# Patient Record
Sex: Male | Born: 1990 | Race: White | Hispanic: No | Marital: Single | Smoking: Former smoker
Health system: Southern US, Community
[De-identification: ages and names within clinical notes are randomized; demographics above are authoritative.]

## PROBLEM LIST (undated history)

## (undated) DIAGNOSIS — F419 Anxiety disorder, unspecified: Secondary | ICD-10-CM

## (undated) DIAGNOSIS — F1910 Other psychoactive substance abuse, uncomplicated: Secondary | ICD-10-CM

## (undated) DIAGNOSIS — K219 Gastro-esophageal reflux disease without esophagitis: Secondary | ICD-10-CM

## (undated) DIAGNOSIS — F329 Major depressive disorder, single episode, unspecified: Secondary | ICD-10-CM

## (undated) DIAGNOSIS — F909 Attention-deficit hyperactivity disorder, unspecified type: Secondary | ICD-10-CM

## (undated) HISTORY — PX: KNEE SURGERY: SHX244

## (undated) HISTORY — PX: HERNIA REPAIR: SHX51

---
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
## 2010-08-08 ENCOUNTER — Encounter: Payer: Self-pay | Admitting: Sports Medicine

## 2010-11-30 NOTE — Procedures (Signed)
[facility]                              EXERCISE TREADMILL   NAME:[name], Craig Howe                      MRN:          [id number]  DATE:03/26/2007                            DOB:          Jan 22, 1991    INTRODUCTION:  The patient is a 20 year old young man who has recently  had episodes of chest tightness, palpitations and dizziness occurring  while running back kick offs for touchdowns in his high school football  games. He was found to have increased heart rates of up to 140 beats per  minute following these episodes with the sensation of dizziness. He is  referred for exercise treadmill testing to try to reproduce his  symptoms.   PROCEDURE:  After informed consent was obtained, the patient was prepped  in the usual manner. He was placed on the treadmill and underwent a  Bruce protocol exercise test where the patient walked 18 minutes,  completing stage 6 of a Bruce protocol. His heart rate was in the 80s,  and it gradually increased up to 160 beats per minute. His blood  pressure increased approximately from 130 mmHg up to 170 mmHg. He  achieved 20 METS of exercise. The test was stopped secondary to M.D.  discretion. Electrocardiogram evaluation demonstrated no ST segment  depression and no arrhythmias were noted on exercise. He was allowed to  recover in the usual manner without any difficulties. His heart rate and  blood pressure returned back to normal limits.   COMPLICATIONS:  There were no immediate procedure complications.   RESULTS:  This demonstrates a clinically and electrically negative  exercise treadmill test with no inducible arrhythmias.     Doylene Canning. Ladona Ridgel, MD  Electronically Signed    GWT/MedQ  DD: 03/26/2007  DT: 03/27/2007  Job #: [id number]   cc:   [name], M.D.  Genene Churn. [name], P.A.

## 2010-11-30 NOTE — Op Note (Signed)
NAME:  Craig Howe, Craig Howe               ACCOUNT NO.:  [id number]   MEDICAL RECORD NO.:  [id number]          PATIENT TYPE:  AMB   LOCATION:  DSC                          FACILITY:  [facility]   PHYSICIAN:  Robert A. Thurston Hole, M.D. DATE OF BIRTH:  May 13, 1991   DATE OF PROCEDURE:  07/07/2008  DATE OF DISCHARGE:                               OPERATIVE REPORT   PREOPERATIVE DIAGNOSIS:  Left knee medial plica and synovitis.   POSTOPERATIVE DIAGNOSIS:  Left knee medial plica and synovitis.   PROCEDURE:  Left knee examination under anesthesia followed by  arthroscopic medial plica excision with partial synovectomy.   SURGEON:  Elana Alm. Thurston Hole, MD   ANESTHESIA:  General.   OPERATIVE TIME:  30 minutes.   COMPLICATIONS:  None.   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  Significant pain.  Exam and MRI initially revealed  medial femoral condyle deep contusion with a posttraumatic plica.  He  has failed conservative care and has had persistent pain and is now to  undergo arthroscopy.   DESCRIPTION:  [name] was brought to the operating room on July 07, 2008, after a knee block was placed in the holding area by  Anesthesia.  He was placed on the operative table in the supine  position.  His left knee was examined under anesthesia.  He had full  range of motion of his knee with stable ligamentous exam with normal  patellar tracking.  Left leg was prepped using sterile DuraPrep and  draped using sterile technique.  Originally, through an anterolateral  portal, the arthroscope with a pump attached was placed into an  anteromedial portal and an arthroscopic probe was placed.  On initial  inspection, medial compartment of the articular cartilage was normal.  Medial meniscus was normal.  Intercondylar notch was inspected and  anterior and posterior cruciate ligaments were normal.   Lateral  compartment was inspected.  The articular cartilage was normal.  Lateral  meniscus was normal.  Popliteus tendon was normal.  Patellofemoral joint  and articular cartilage was inspected.  The articular cartilage was  normal.  The patella tracked normally.  There was a large thickened  medial plica band that was scarred and impinging into the medial  patellofemoral joint and this was thoroughly resected with a shaver.  Moderate lateral synovitis was also debrided, otherwise medial lateral  gutters were free of pathology.  After this was done, I felt that all  pathology had been satisfactorily addressed.  The instruments were  removed.  Portals were closed with 3-0 nylon suture and injected with  0.25% Marcaine with epinephrine.  Sterile dressing was applied, and the  patient was awakened and taken to the recovery room in stable condition.   FOLLOWUP CARE:  [name] will be followed as an outpatient on Vicodin and  Mobic.  He will be seen back in the office in a week for sutures out and  followup.      Robert A. Thurston Hole, M.D.  Electronically Signed  RAW/MEDQ  D:  07/07/2008  T:  07/07/2008  Job:  [id number]

## 2010-11-30 NOTE — Op Note (Signed)
NAME:  Craig Howe, Craig Howe               ACCOUNT NO.:  [id number]   MEDICAL RECORD NO.:  [id number]          PATIENT TYPE:  OIB   LOCATION:  6120                         FACILITY:  [facility]   PHYSICIAN:  Robert A. Thurston Hole, M.D. DATE OF BIRTH:  1991-02-09   DATE OF PROCEDURE:  DATE OF DISCHARGE:                               OPERATIVE REPORT   PREOPERATIVE DIAGNOSIS:  Left knee infection 1 week status post left  knee arthroscopy.   POSTOPERATIVE DIAGNOSIS:  Left knee infection 1 week status post left  knee arthroscopy.   PROCEDURE:  Left knee EUA followed by arthroscopic lavage with partial  synovectomy.   SURGEON:  Elana Alm. Thurston Hole, MD   ASSISTANT:  Julien Girt, P.A.   ANESTHESIA:  General.   OPERATIVE TIME:  Forty minutes.   COMPLICATIONS:  None.   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  He has developed increasing pain,  swelling, and fevers over the past 24-48 hours.  The aspiration  performed in the office today has revealed 40,000 white cells with no  organisms seen on Gram stain, but due to this significant finding and  probable infection, he is now to undergo arthroscopy with arthroscopic  lavage.   DESCRIPTION:  Aurthur was brought to the operating room on July 15, 2008, placed on the operating table in supine position.  After an  adequate level of general anesthesia was obtained, his left knee was  examined.  Range of motion from 0-90 degrees, 2+ effusion, knee stable  with normal patellar tracking.  Left leg was prepped using sterile  DuraPrep and draped using sterile technique.  Originally, through his  previous anterolateral and anteromedial incision, new arthroscopic  portals were made.  The arthroscope was placed in the knee from the  lateral portal and serous bloody fluid was obtained for further  cultures, both aerobic  and anaerobic.  After these cultures were  obtained, he received vancomycin 1 g intraoperatively.  On initial  inspection of the knee medial compartment, medial femoral condyle,  medial tibial plateau, articular cartilage was normal.  Medial meniscus  was normal.  Intercondylar notch inspected.  Anterior and posterior  cruciate ligaments were found to be normal.  Lateral compartment  inspected.  Articular cartilage  was normal.  Lateral meniscus was  normal.  Patellofemoral joint, articular cartilage was normal.  The  patella tracked normally.  There was found to be erythematous and  somewhat hemorrhagic synovitis in the anterior compartment, medial, and  lateral gutters, and suprapatellar pouch.  No frank purulence was noted,  but the significant synovitis was noted and a subtotal synovectomy was  carried out.  A 9000 mL of sterile saline were used to irrigate and  lavage the knee thoroughly during the course of this arthroscopic lavage  and subtotal synovectomy.  After this was done, no further pathology was  noted.  The arthroscopic  instruments were removed.  A drain was placed in the lateral portal and  both medial and lateral  portals were closed with 3-0 nylon suture.  Sterile dressings were applied and then the patient awakened and taken  to recovery in stable condition.  Needle and sponge counts correct x2 at  the end of the case.      Robert A. Thurston Hole, M.D.  Electronically Signed     RAW/MEDQ  D:  07/15/2008  T:  07/16/2008  Job:  [id number]

## 2010-11-30 NOTE — Letter (Signed)
March 26, 2007    Mr. Zonia Kief, PA-C  [facility]  [address]  [place], [place] [address]   RE:  Craig Howe, Craig Howe  MRN:  [id number]  /  DOB:  1990-09-18   Dear Mr. [name],   Thank you for referring Craig Howe for EP evaluation of recurrent  episodes of dizziness, shortness of breath, and chest discomfort.  As  you know, the patient is a 20 year old young man who plays football in  [employer], and who experienced an episode of shortness of  breath, chest discomfort, and palpitations following the running back of  a 50-yard touchdown during his football game last Thursday night.  The  patient eventually felt better but did not play the second half.  I note  in your letter that he had a carotid sinus massage which did not affect  his heart rate.  The patient does admit to using multiple vitamins,  protein supplements, and creatine.  He denies the use of any anabolic  steroids.  A long clinic note has been dictated and will be sent to you,  if not already received, basically highlighting the additional past  medical history and physical exam findings on Craig Howe.  To summarize  these findings we found today that his heart rate increased fairly  markedly by going from lying to the standing position, 56 to 97 beats  per minutes from lying to standing, raising the question of volume  depletion and orthostasis contributing to his symptoms.  The patient  does admit to not, perhaps, drinking as much as he knows that he should.  Apparently, his prior history of competitive wrestling has resulted in  him tending to avoid drinking to keep his weight down.  The patient had  a 2D echo today which demonstrated normal left ventricular systolic size  and function, and normal right ventricular systolic size and function,  and no valvular abnormalities.  In addition, the cardiac chambers were  not thickened.  An exercise treadmill test  was also carried out where he  walked 18 minutes on a Bruce protocol, achieving a very high rate  pressure product and exercise load.  Despite this very advanced exercise  test, he had no evidence of ischemia, no induced split arrhythmias were  noted, both during exercise and in recovery.   With all of the above, I recommended that the patient be allowed to  return to competitive sports.  I have recommended that he very  vigorously increase his fluid hydration and salt intake, as I suspect a  lot of this is related to dehydration in the setting of very, very  vigorous competitive sports.  If his symptoms were to recur, then  additional evaluation would be considered.  Finally, I have asked that  he stop any protein or metabolic supplements.  I do not think he is  using steroids, but I have, again, encouraged him not to drink anything  except for fluids and eat a good, healthy diet.   Thanks, again, for referring Craig Howe for EP evaluation.    Sincerely,      Craig Canning. Ladona Ridgel, MD  Electronically Signed    GWT/MedQ  DD: 03/26/2007  DT: 03/27/2007  Job #: [id number]   CC:   [name], PA-C

## 2010-11-30 NOTE — Assessment & Plan Note (Signed)
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  The patient is a very pleasant 20-year-  old young man, whose health has been very good for the last 3 years.  He  has a history of ADHD and has been on Concerta for 3 years.  The patient  exercises very vigorously and regularly.  He states that during the  winter and spring he wrestles competitively and has played football for  several years now.  He works out extensively with [employer].  The patient  was noted to have run back a 90-yard touchdown run during his last  football game, and afterwards became lightheaded and noted some chest  tightness and shortness of breath.  Eventually, these spells improved,  but he was noted by Zonia Kief, P.A., with [facility], to be  tachycardic afterwards, with a persistent elevation of his pulse rate of  144 beats per minute.  By report, carotid sinus massage was carried out,  which did not slow down his heart racing.  Eventually, he did settle  down, and his heart rate returned back to normal.  He was not allowed to  play the second half of his football game.  The patient has never had  frank syncope.  He does admit to have taken some nutritional supplements  in the last several weeks, including creatine and a protein-based  supplement, but specifically denies using anabolic steroids.  He had no  other specific complaints.  He does note that these episodes have been  present for the last couple of weeks, during his football games.   ADDITIONAL PAST MEDICAL HISTORY:   Negative.   FAMILY HISTORY:  Negative for premature coronary disease or other  medical problems.  His parents are both with him today and are in good  health.   PAST SURGICAL HISTORY:  Notable for hernia repair over 10 years ago.   MEDICATIONS:  Concerta 27 mg daily.   REVIEW OF SYSTEMS:  Negative, except as noted in the HPI, otherwise all  systems reviewed and found to be negative.   PHYSICAL EXAMINATION:  GENERAL:  He is a very pleasant, well-appearing  young man in no distress.  VITAL SIGNS:  His initial heart rate was 56, with a blood pressure of  116/63.  On standing, his blood pressure increased to 127, and his heart  rate actually increased to 90 beats per minute.  After 5 minutes, his  heart rate was 97 beats per minute.  He was asymptomatic.  HEENT:  Normocephalic, atraumatic.  Pupils equal and round.  Oropharynx  is moist.  Sclerae anicteric.  NECK:  Revealed no jugular venous distention.  There is no thyromegaly.  The trachea is midline.  The carotids are 2+ and symmetric.  LUNGS:  Clear bilateral to auscultation.  No wheezes, rales or rhonchi  are present.  CARDIOVASCULAR:  Regular rate and rhythm, normal S1 and S2.  There are  no murmurs, rubs or gallops present.  There was no S3, and I could not  appreciate an S4 gallop.  His PMI was not enlarged, nor was it laterally  displaced.  ABDOMEN:  Exam was scaphoid, nontender, nondistended.  There is no  organomegaly.  Bowel sounds are present.  There is no rebound or  guarding.  EXTREMITIES:  Demonstrated no cyanosis, clubbing or edema.  The pulses  are 2+ and symmetric.  NEUROLOGIC:  Alert and oriented x 3.  Normal axis intervals.  MUSCULOSKELETAL:  Normal.  SKIN:  Normal.   EKG demonstrates sinus rhythm with incomplete right bundle branch block.   A 2D echo was obtained.  While the patient was here.  The final results  will be dictated but demonstrated normal LV and RV systolic function,  with no valvular lesions  and no effusions.   IMPRESSION:  Chest pain and shortness of breath, dizziness and  lightheadedness following extreme exertion.   DISCUSSION:  The etiology of the patient's symptoms are still unclear;  however, he has showed no evidence of any occult ischemic heart disease  on stress testing at very maximum work loads, and his 2D echo is normal  with no evidence of septal or left ventricular free wall thickening,  essentially excluding hypertrophic cardiomyopathy.  In addition, his  right ventricle function is normal, and this would certainly go against  a erythemogenic right ventricular dysplasia.  The patient does have  evidence of some orthostasis based on increased heart rate, in response  to upright posture, raising the question of whether or not his main  complaint was that he was in fact dehydrated.  He did note on further  question that, at times around practices, he will get cramps in his arms  and legs.  With all of the above, I have recommended that the patient be  allowed to return to competitive sports.  I have asked that he increase  his fluid and sodium intake and increase his drinking of sports drinks.  Obviously, if he gets tired, he should be allowed to rest.  I will plan  to see the patient back on an as-needed basis.     Doylene Canning. Ladona Ridgel, MD  Electronically Signed    GWT/MedQ  DD: 03/26/2007  DT: 03/27/2007  Job #: [id number]   cc:   [facility], [place]. [name], P.A.

## 2011-04-22 LAB — CBC
HCT: 37 % — ABNORMAL LOW (ref 39.0–52.0)
HCT: 38 % — ABNORMAL LOW (ref 39.0–52.0)
MCV: 89.6 fL (ref 78.0–100.0)
Platelets: 175 10*3/uL (ref 150–400)
RBC: 4.13 MIL/uL — ABNORMAL LOW (ref 4.22–5.81)
RBC: 4.14 MIL/uL — ABNORMAL LOW (ref 4.22–5.81)
WBC: 5.5 10*3/uL (ref 4.0–10.5)
WBC: 5.9 10*3/uL (ref 4.0–10.5)

## 2011-04-22 LAB — PROTIME-INR
INR: 1 (ref 0.00–1.49)
Prothrombin Time: 13.7 seconds (ref 11.6–15.2)
Prothrombin Time: 14.6 seconds (ref 11.6–15.2)

## 2011-04-22 LAB — ANAEROBIC CULTURE

## 2011-04-22 LAB — BASIC METABOLIC PANEL
BUN: 10 mg/dL (ref 6–23)
Calcium: 9.3 mg/dL (ref 8.4–10.5)
Glucose, Bld: 97 mg/dL (ref 70–99)
Potassium: 3.9 mEq/L (ref 3.5–5.1)
Sodium: 137 mEq/L (ref 135–145)
Sodium: 138 mEq/L (ref 135–145)

## 2011-04-22 LAB — CULTURE, BLOOD (ROUTINE X 2)

## 2011-04-22 LAB — APTT: aPTT: 40 seconds — ABNORMAL HIGH (ref 24–37)

## 2011-04-22 LAB — BODY FLUID CULTURE

## 2011-04-22 LAB — POCT HEMOGLOBIN-HEMACUE: Hemoglobin: 16.1 g/dL (ref 13.0–17.0)

## 2016-10-26 DIAGNOSIS — F1121 Opioid dependence, in remission: Secondary | ICD-10-CM | POA: Insufficient documentation

## 2016-10-26 DIAGNOSIS — F322 Major depressive disorder, single episode, severe without psychotic features: Secondary | ICD-10-CM | POA: Insufficient documentation

## 2016-10-26 DIAGNOSIS — F902 Attention-deficit hyperactivity disorder, combined type: Secondary | ICD-10-CM | POA: Insufficient documentation

## 2017-07-25 ENCOUNTER — Ambulatory Visit (HOSPITAL_COMMUNITY)
Admission: RE | Admit: 2017-07-25 | Discharge: 2017-07-25 | Disposition: A | Discharge status: Home / Self Care | Payer: BLUE CROSS/BLUE SHIELD | Source: Ambulatory Visit | Attending: Cardiovascular Disease | Admitting: Cardiovascular Disease

## 2017-07-25 ENCOUNTER — Other Ambulatory Visit (HOSPITAL_COMMUNITY): Payer: Self-pay | Admitting: Orthopedic Surgery

## 2017-07-25 DIAGNOSIS — M79604 Pain in right leg: Secondary | ICD-10-CM | POA: Insufficient documentation

## 2017-07-25 DIAGNOSIS — M79661 Pain in right lower leg: Secondary | ICD-10-CM | POA: Diagnosis not present

## 2017-07-25 DIAGNOSIS — R936 Abnormal findings on diagnostic imaging of limbs: Secondary | ICD-10-CM | POA: Insufficient documentation

## 2017-07-25 DIAGNOSIS — M7989 Other specified soft tissue disorders: Principal | ICD-10-CM

## 2017-08-15 ENCOUNTER — Encounter: Payer: Self-pay | Admitting: Physician Assistant

## 2017-08-15 ENCOUNTER — Ambulatory Visit: Payer: BLUE CROSS/BLUE SHIELD | Admitting: Physician Assistant

## 2017-08-15 VITALS — BP 128/75 | HR 74 | Temp 96.8°F | Ht 74.0 in | Wt 191.4 lb

## 2017-08-15 DIAGNOSIS — Z87898 Personal history of other specified conditions: Secondary | ICD-10-CM | POA: Diagnosis not present

## 2017-08-15 DIAGNOSIS — F1911 Other psychoactive substance abuse, in remission: Secondary | ICD-10-CM

## 2017-08-15 DIAGNOSIS — Z01818 Encounter for other preprocedural examination: Secondary | ICD-10-CM

## 2017-08-15 NOTE — Progress Notes (Signed)
BP 128/75   Pulse 74   Temp (!) 96.8 F (36 C) (Oral)   Ht 6\' 2"  (1.88 m)   Wt 191 lb 6.4 oz (86.8 kg)   BMI 24.57 kg/m    Subjective:    Patient ID: Craig Howe, male    DOB: [birth year], 27 y.o.   MRN: [id number]  HPI: UNNAMED ZEIEN is a 27 y.o. male presenting on 08/15/2017 for New Patient (Initial Visit) (Needs surgical clearance ) and std check  This patient comes in for pre-op physical examination. All medications are reviewed today. There are no reports of any problems with the medications. All of the medical conditions are reviewed and updated.  Lab work is reviewed and will be ordered as medically necessary. There are no new problems reported with today's visit.  Patient reports doing well overall. Old EKG changes of RBBB are found and same as 2008 on stress test. Medically clear.  Relevant past medical, surgical, family and social history reviewed and updated as indicated. Allergies and medications reviewed and updated.  History reviewed. No pertinent past medical history.  Past Surgical History:  Procedure Laterality Date  . HERNIA REPAIR    . KNEE SURGERY      Review of Systems  Constitutional: Negative.  Negative for appetite change and fatigue.  HENT: Negative.   Eyes: Negative.  Negative for pain and visual disturbance.  Respiratory: Negative.  Negative for cough, chest tightness, shortness of breath and wheezing.   Cardiovascular: Negative.  Negative for chest pain, palpitations and leg swelling.  Gastrointestinal: Negative.  Negative for abdominal pain, diarrhea, nausea and vomiting.  Endocrine: Negative.   Genitourinary: Negative.   Musculoskeletal: Negative.   Skin: Negative.  Negative for color change and rash.  Neurological: Negative.  Negative for weakness, numbness and headaches.  Psychiatric/Behavioral: Negative.     Allergies as of 08/15/2017   No Known Allergies     Medication List        Accurate as of 08/15/17  3:43 PM. Always use  your most recent med list.          cloNIDine 0.1 MG tablet Commonly known as:  CATAPRES TAKE 1 TABLET TWICE DAILY FOR WITHDRAWAL   ZUBSOLV 2.9-0.71 MG Subl Generic drug:  Buprenorphine HCl-Naloxone HCl PLACE 1 TABLET UNDER TONGUE TWICE DIALY          Objective:    BP 128/75   Pulse 74   Temp (!) 96.8 F (36 C) (Oral)   Ht 6\' 2"  (1.88 m)   Wt 191 lb 6.4 oz (86.8 kg)   BMI 24.57 kg/m   No Known Allergies  Physical Exam  Constitutional: He appears well-developed and well-nourished.  HENT:  Head: Normocephalic and atraumatic.  Eyes: Conjunctivae and EOM are normal. Pupils are equal, round, and reactive to light.  Neck: Normal range of motion. Neck supple.  Cardiovascular: Normal rate, regular rhythm and normal heart sounds.  Pulmonary/Chest: Effort normal and breath sounds normal.  Abdominal: Soft. Bowel sounds are normal.  Musculoskeletal: Normal range of motion.  Skin: Skin is warm and dry.  Nursing note and vitals reviewed.       Assessment & Plan:   1. Preoperative clearance Medically clear for surgery 02/30/2019 EKG changes are stable since 2008 No new findings - EKG 12-Lead  2. History of substance abuse     Current Outpatient Medications:  .  cloNIDine (CATAPRES) 0.1 MG tablet, TAKE 1 TABLET TWICE DAILY FOR WITHDRAWAL, Disp: , Rfl: 0 .  ZUBSOLV 2.9-0.71 MG SUBL, PLACE 1 TABLET UNDER TONGUE TWICE DIALY, Disp: , Rfl: 0 Continue all other maintenance medications as listed above.  Follow up plan: No Follow-up on file.  Educational handout given for survey  Remus LofflerAngel S. Zniyah Midkiff PA-C [facility] [address]  [place], [place] [id number] [phone]   08/15/2017, 3:43 PM

## 2017-08-15 NOTE — Patient Instructions (Signed)
In a few days you may receive a survey in the mail or online from [employer] regarding your visit with us today. Please take a moment to fill this out. Your feedback is very important to our whole office. It can help us better understand your needs as well as improve your experience and satisfaction. Thank you for taking your time to complete it. We care about you.  Jewel Venditto, PA-C  

## 2017-08-16 ENCOUNTER — Other Ambulatory Visit: Payer: Self-pay | Admitting: Physician Assistant

## 2017-08-16 DIAGNOSIS — Z202 Contact with and (suspected) exposure to infections with a predominantly sexual mode of transmission: Secondary | ICD-10-CM

## 2017-08-25 ENCOUNTER — Encounter: Payer: Self-pay | Admitting: Physical Therapy

## 2017-08-25 ENCOUNTER — Other Ambulatory Visit: Payer: Self-pay

## 2017-08-25 ENCOUNTER — Ambulatory Visit: Payer: BLUE CROSS/BLUE SHIELD | Attending: Orthopedic Surgery | Admitting: Physical Therapy

## 2017-08-25 DIAGNOSIS — M25561 Pain in right knee: Secondary | ICD-10-CM

## 2017-08-25 NOTE — Therapy (Signed)
[facility] [address] [place], [place], [address] Phone: [phone]   Fax:  [phone]  Physical Therapy Evaluation  Patient Details  Name: Craig FurnishDaniel D Howe MRN: [id number] Date of Birth: 12/20/1990 Referring Provider: Frederico Hammananiel Caffrey MD   Encounter Date: 08/25/2017  PT End of Session - 08/25/17 1136    Visit Number  1    Number of Visits  12    Date for PT Re-Evaluation  10/06/17    PT Start Time  1038    PT Stop Time  1106    PT Time Calculation (min)  28 min       History reviewed. No pertinent past medical history.  Past Surgical History:  Procedure Laterality Date  . HERNIA REPAIR    . KNEE SURGERY      There were no vitals filed for this visit.   Subjective Assessment - 08/25/17 1152    Subjective  The patient underwent a right knee arthroscopic surgery on 08/16/17.  He states he was to go to the doctor yesterday but missed his appointment.  He is keeping bandaids over his stitches (port sites).  He under a medial meniscectomy.  He rates his pain at a 8/10 today.  Rest and ice decrease his pain.  Increased up time increases pain.      Patient Stated Goals  Get back to normal.    Currently in Pain?  Yes    Pain Score  8     Pain Location  Knee    Pain Orientation  Right    Pain Descriptors / Indicators  Aching;Throbbing    Pain Type  Surgical pain    Pain Onset  1 to 4 weeks ago    Pain Frequency  Constant    Aggravating Factors   See above.    Pain Relieving Factors  See above.         [facility] - 08/25/17 0001      Assessment   Medical Diagnosis  Left knee scope medial meniscectomy.    Referring Provider  Frederico Hammananiel Caffrey MD    Onset Date/Surgical Date  -- 08/16/17 (surgery date).      Precautions   Precautions  -- Pain-free right quadriceps strengthening.      Restrictions   Weight Bearing Restrictions  No      Balance Screen   Has the patient fallen in the past 6 months  No    Has the  patient had a decrease in activity level because of a fear of falling?   No    Is the patient reluctant to leave their home because of a fear of falling?   No      Home Environment   Living Environment  Private residence      Prior Function   Level of Independence  Independent      Observation/Other Assessments   Observations  Bandaids over right knee scope sites.      Observation/Other Assessments-Edema    Edema  -- Minimal right knee edema.      ROM / Strength   AROM / PROM / Strength  AROM;Strength      AROM   Overall AROM Comments  Full active right knee range of motion.      Strength   Overall Strength Comments  No significant right LE strength deficits.      Palpation   Palpation comment  Patient CC of pain today was in region of righ tknee medial joint  line and superior aspect of right patella.      Ambulation/Gait   Gait Comments  Mild gait antalgia.             Objective measurements completed on examination: See above findings.      [facility] Adult PT Treatment/Exercise - 08/25/17 0001      Modalities   Modalities  Vasopneumatic      Vasopneumatic   Number Minutes Vasopneumatic   10 minutes    Vasopnuematic Location   -- Right knee.    Vasopneumatic Pressure  Medium                  PT Long Term Goals - 08/25/17 1229      PT LONG TERM GOAL #1   Title  Independent with a HEP.    Time  6    Period  Weeks    Status  New      PT LONG TERM GOAL #2   Title  Walk in clinic 500 feet with a normal gait pattern.    Time  6    Period  Weeks    Status  New      PT LONG TERM GOAL #3   Title  Perform a reciprocating stair gait with pain not > 2/10.    Time  6    Period  Weeks    Status  New      PT LONG TERM GOAL #4   Title  Perform ADL's with pain not > 2-3/10.    Time  6    Period  Weeks    Status  New             Plan - 08/25/17 1221    Clinical Impression Statement  The patient presents to [facility] s/p left knee medial  meniscectomy performed on 08/16/17.  He still has stitches over his right knee scope sites as he missed his MD appointment yesterday.  He demonstrates full right knee range of motion and loss significant loss of strength.  He has minimal left knee edema and has a high pain-level. Patient will benefit from skilled physcial therapy intervention to decrease pain and increase function.    Clinical Presentation  Stable    Clinical Presentation due to:  Good surgicla outcome.    Clinical Decision Making  Low    Rehab Potential  Excellent    PT Frequency  2x / week    PT Duration  6 weeks    PT Treatment/Interventions  ADLs/Self Care Home Management;Cryotherapy;Electrical Stimulation;Functional mobility training;Therapeutic activities;Neuromuscular re-education;Therapeutic exercise;Patient/family education;Manual techniques;Vasopneumatic Device    PT Next Visit Plan  Pain-free left quadricep exercises; stationary bike; vasopneumatic and electrical stimulation.    Consulted and Agree with Plan of Care  Patient       Patient will benefit from skilled therapeutic intervention in order to improve the following deficits and impairments:  Abnormal gait, Decreased activity tolerance, Increased edema, Pain  Visit Diagnosis: Acute pain of right knee - Plan: PT plan of care cert/re-cert     Problem List Patient Active Problem List   Diagnosis Date Noted  . History of substance abuse 08/15/2017    Craig Howe, [place] MPT 08/25/2017, 12:43 PM  [facility] [address] [place], [place], [address] Phone: [phone]   Fax:  [phone]  Name: Craig REIERSON MRN: [id number] Date of Birth: 10/05/1990

## 2017-08-29 ENCOUNTER — Encounter: Payer: Self-pay | Admitting: Physical Therapy

## 2017-08-29 ENCOUNTER — Ambulatory Visit: Payer: BLUE CROSS/BLUE SHIELD | Admitting: Physical Therapy

## 2017-08-29 DIAGNOSIS — M25561 Pain in right knee: Secondary | ICD-10-CM

## 2017-08-29 NOTE — Therapy (Signed)
[facility] [address] [place], [place], [address] Phone: [phone]   Fax:  [phone]  Physical Therapy Treatment  Patient Details  Name: Craig Howe MRN: [id number] Date of Birth: [birth year] Referring Provider: Frederico Hamman MD   Encounter Date: 08/29/2017  PT End of Session - 08/29/17 0737    Visit Number  2    Number of Visits  12    Date for PT Re-Evaluation  10/06/17    PT Start Time  0735    PT Stop Time  0821    PT Time Calculation (min)  46 min    Activity Tolerance  Patient tolerated treatment well    Behavior During Therapy  [facility] for tasks assessed/performed       History reviewed. No pertinent past medical history.  Past Surgical History:  Procedure Laterality Date  . HERNIA REPAIR    . KNEE SURGERY      There were no vitals filed for this visit.  Subjective Assessment - 08/29/17 0736    Subjective  Reports that he got his stitches removed yesterday.    Patient Stated Goals  Get back to normal.    Currently in Pain?  Yes    Pain Score  3     Pain Location  Knee    Pain Orientation  Right    Pain Descriptors / Indicators  Sore    Pain Type  Surgical pain    Pain Onset  1 to 4 weeks ago         [facility] PT Assessment - 08/29/17 0001      Assessment   Medical Diagnosis  Left knee scope medial meniscectomy.    Onset Date/Surgical Date  08/16/17    Next MD Visit  TBD      Restrictions   Weight Bearing Restrictions  No                  [facility] Adult PT Treatment/Exercise - 08/29/17 0001      Exercises   Exercises  Knee/Hip      Knee/Hip Exercises: Aerobic   Stationary Bike  L2 x12 min      Knee/Hip Exercises: Standing   Terminal Knee Extension  Strengthening;Right;20 reps;Limitations    Terminal Knee Extension Limitations  Pink XTS 5 sec hold      Knee/Hip Exercises: Supine   Bridges  Strengthening;Both;15 reps    Straight Leg Raises  Strengthening;Right;2 sets;10 reps     Straight Leg Raise with External Rotation  Strengthening;Right;2 sets;10 reps      Modalities   Modalities  Programmer, applications Location  R knee    Electrical Stimulation Action  IFC    Electrical Stimulation Parameters  1-10 hz x15 min    Electrical Stimulation Goals  Pain;Edema      Vasopneumatic   Number Minutes Vasopneumatic   15 minutes    Vasopnuematic Location   Knee    Vasopneumatic Pressure  Medium    Vasopneumatic Temperature   34             PT Education - 08/29/17 0809    Education provided  Yes    Education Details  HEP- SLR, SLR with ER, hip abduction    Person(s) Educated  Patient    Methods  Explanation;Handout    Comprehension  Verbalized understanding          PT Long Term Goals - 08/25/17 1229  PT LONG TERM GOAL #1   Title  Independent with a HEP.    Time  6    Period  Weeks    Status  New      PT LONG TERM GOAL #2   Title  Walk in clinic 500 feet with a normal gait pattern.    Time  6    Period  Weeks    Status  New      PT LONG TERM GOAL #3   Title  Perform a reciprocating stair gait with pain not > 2/10.    Time  6    Period  Weeks    Status  New      PT LONG TERM GOAL #4   Title  Perform ADL's with pain not > 2-3/10.    Time  6    Period  Weeks    Status  New            Plan - 08/29/17 16100814    Clinical Impression Statement  Patient arrived to treatment with steristrips placed over incision ports of R knee and no AD. Patient presented in clinic with only low level R knee soreness. Patient guided through mid level R knee standing strengthening exercises with no complaints of any pain. Patient ambulated forward and lateral steps with only minimal R knee weakness but patient has been ambulating stairs at home. Patient also guided through supine knee strengthening exercises with only weakness reported by patient. VCs to maintain quad activation/ straight  knee provided by PTA. Patient provided supine knee strengthening HEP for home with education regarding parameters and technique. Normal modalities response noted following removal of the modalities.    Rehab Potential  Excellent    PT Frequency  2x / week    PT Duration  6 weeks    PT Treatment/Interventions  ADLs/Self Care Home Management;Cryotherapy;Electrical Stimulation;Functional mobility training;Therapeutic activities;Neuromuscular re-education;Therapeutic exercise;Patient/family education;Manual techniques;Vasopneumatic Device    PT Next Visit Plan  Pain-free left quadricep exercises; stationary bike; vasopneumatic and electrical stimulation.    PT Home Exercise Plan  HEP- SLR, SLR with ER, hip abduction    Consulted and Agree with Plan of Care  Patient       Patient will benefit from skilled therapeutic intervention in order to improve the following deficits and impairments:  Abnormal gait, Decreased activity tolerance, Increased edema, Pain  Visit Diagnosis: Acute pain of right knee     Problem List Patient Active Problem List   Diagnosis Date Noted  . History of substance abuse 08/15/2017    Marvell FullerKelsey P Nykiah Ma, PTA 08/29/2017, 8:31 AM  [facility] [address] [place], [place], [address] Phone: [phone]   Fax:  [phone]  Name: Craig FurnishDaniel D Howe MRN: [id number] Date of Birth: Jun 13, 1991

## 2017-08-29 NOTE — Patient Instructions (Addendum)
Strengthening: Straight Leg Raise (Phase 1)    Tighten muscles on front of right thigh, then lift leg __6__ inches from surface, keeping knee locked.  Repeat ___10_ times per set. Do __2__ sets per session. Do _2-3___ sessions per day.  http://orth.exer.us/614   Copyright  VHI. All rights reserved.  Straight Leg Raise: With External Leg Rotation    Lie on back with right leg straight, opposite leg bent. Rotate straight leg out and lift __6__ inches. Repeat _10___ times per set. Do __2__ sets per session. Do __2-3__ sessions per day.  http://orth.exer.us/728   Copyright  VHI. All rights reserved.  Strengthening: Hip Abduction (Side-Lying)    Tighten muscles on front of right thigh, then lift leg _6___ inches from surface, keeping knee locked.  Repeat __10__ times per set. Do __2__ sets per session. Do _2-3__ sessions per day.  http://orth.exer.us/622   Copyright  VHI. All rights reserved.

## 2017-08-31 ENCOUNTER — Encounter: Payer: PRIVATE HEALTH INSURANCE | Admitting: Physical Therapy

## 2017-09-01 ENCOUNTER — Encounter: Payer: PRIVATE HEALTH INSURANCE | Admitting: Physical Therapy

## 2017-09-06 ENCOUNTER — Ambulatory Visit: Payer: BLUE CROSS/BLUE SHIELD | Admitting: Physical Therapy

## 2017-09-06 ENCOUNTER — Encounter: Payer: Self-pay | Admitting: Physical Therapy

## 2017-09-06 DIAGNOSIS — M25561 Pain in right knee: Secondary | ICD-10-CM | POA: Diagnosis not present

## 2017-09-06 NOTE — Therapy (Signed)
[facility] [address] [place], [place], [address] Phone: [phone]   Fax:  [phone]  Physical Therapy Treatment  Patient Details  Name: Craig Howe MRN: [id number] Date of Birth: 1990/11/08 Referring Provider: Frederico Hammananiel Caffrey MD   Encounter Date: 09/06/2017  PT End of Session - 09/06/17 0745    Visit Number  3    Number of Visits  12    Date for PT Re-Evaluation  10/06/17    PT Start Time  0733    PT Stop Time  0815    PT Time Calculation (min)  42 min    Activity Tolerance  Patient tolerated treatment well    Behavior During Therapy  [facility] for tasks assessed/performed       History reviewed. No pertinent past medical history.  Past Surgical History:  Procedure Laterality Date  . HERNIA REPAIR    . KNEE SURGERY      There were no vitals filed for this visit.  Subjective Assessment - 09/06/17 0744    Subjective  Reports that he slipped on wet ground and caught himself on his R knee. Reports that after surgery the MD said his R ACL was loose but states that his knee doesn't feel like he tore it as he tore his L ACL previously. Reports that his knee is still swollen.    Patient Stated Goals  Get back to normal.    Currently in Pain?  Yes    Pain Score  5     Pain Location  Knee    Pain Orientation  Right    Pain Descriptors / Indicators  Sore    Pain Type  Surgical pain    Pain Onset  1 to 4 weeks ago         [facility] - 09/06/17 0001      Assessment   Medical Diagnosis  Left knee scope medial meniscectomy.    Onset Date/Surgical Date  08/16/17    Next MD Visit  08/2017      Restrictions   Weight Bearing Restrictions  No                  [facility] Adult PT Treatment/Exercise - 09/06/17 0001      Knee/Hip Exercises: Aerobic   Stationary Bike  L3 x14 min      Knee/Hip Exercises: Supine   Short Arc Quad Sets  Strengthening;Right;20 reps;Limitations    Short Arc Quad Sets Limitations  5  sec hold x30 reps    Jabil CircuitBridges  Strengthening;Both;15 reps    Straight Leg Raises  Strengthening;Right;2 sets;10 reps    Straight Leg Raise with External Rotation  Strengthening;Right;2 sets;10 reps      Knee/Hip Exercises: Sidelying   Hip ABduction  Strengthening;Right;20 reps    Clams  R clam x20 reps      Knee/Hip Exercises: Prone   Straight Leg Raises  Strengthening;Right;20 reps      Modalities   Modalities  Programmer, applicationslectrical Stimulation;Vasopneumatic      Electrical Stimulation   Electrical Stimulation Location  R knee    Electrical Stimulation Action  IFC    Electrical Stimulation Parameters  1-10 hz x15 min    Electrical Stimulation Goals  Pain      Vasopneumatic   Number Minutes Vasopneumatic   15 minutes    Vasopnuematic Location   Knee    Vasopneumatic Pressure  Medium    Vasopneumatic Temperature   34  PT Long Term Goals - 08/25/17 1229      PT LONG TERM GOAL #1   Title  Independent with a HEP.    Time  6    Period  Weeks    Status  New      PT LONG TERM GOAL #2   Title  Walk in clinic 500 feet with a normal gait pattern.    Time  6    Period  Weeks    Status  New      PT LONG TERM GOAL #3   Title  Perform a reciprocating stair gait with pain not > 2/10.    Time  6    Period  Weeks    Status  New      PT LONG TERM GOAL #4   Title  Perform ADL's with pain not > 2-3/10.    Time  6    Period  Weeks    Status  New            Plan - 09/06/17 1610    Clinical Impression Statement  Patient arrived in treatment with reports of recent slip on wet ground will only report of soreness and inflammation of R knee. Patient guided through low level R knee strengthening without complaint of any increased pain. Patient did report complaint of difficulty with SLR. Minimal superficial R knee abrasion observed from slip on wet ground. Normal modalities response noted following removal of the modalities.    Rehab Potential  Excellent    PT  Frequency  2x / week    PT Duration  6 weeks    PT Treatment/Interventions  ADLs/Self Care Home Management;Cryotherapy;Electrical Stimulation;Functional mobility training;Therapeutic activities;Neuromuscular re-education;Therapeutic exercise;Patient/family education;Manual techniques;Vasopneumatic Device    PT Next Visit Plan  Pain-free left quadricep exercises; stationary bike; vasopneumatic and electrical stimulation.    PT Home Exercise Plan  HEP- SLR, SLR with ER, hip abduction    Consulted and Agree with Plan of Care  Patient       Patient will benefit from skilled therapeutic intervention in order to improve the following deficits and impairments:  Abnormal gait, Decreased activity tolerance, Increased edema, Pain  Visit Diagnosis: Acute pain of right knee     Problem List Patient Active Problem List   Diagnosis Date Noted  . History of substance abuse 08/15/2017    Marvell Fuller, PTA 09/06/2017, 8:35 AM  [facility] [address] [place], [place], [address] Phone: [phone]   Fax:  [phone]  Name: Craig Howe MRN: [id number] Date of Birth: 1990/09/09

## 2017-09-08 ENCOUNTER — Encounter: Payer: PRIVATE HEALTH INSURANCE | Admitting: Physical Therapy

## 2017-09-12 ENCOUNTER — Ambulatory Visit: Payer: BLUE CROSS/BLUE SHIELD | Admitting: Physical Therapy

## 2017-09-12 DIAGNOSIS — M25561 Pain in right knee: Secondary | ICD-10-CM

## 2017-09-12 NOTE — Therapy (Signed)
[facility] [address] [place], [place], [address] Phone: [phone]   Fax:  [phone]  Physical Therapy Treatment  Patient Details  Name: Craig Howe MRN: [id number] Date of Birth: 1991/06/28 Referring Provider: Frederico Hammananiel Caffrey MD   Encounter Date: 09/12/2017  PT End of Session - 09/12/17 0732    Visit Number  4    Number of Visits  12    Date for PT Re-Evaluation  10/06/17    PT Start Time  0730    PT Stop Time  0823    PT Time Calculation (min)  53 min    Activity Tolerance  Patient tolerated treatment well    Behavior During Therapy  [facility] for tasks assessed/performed       No past medical history on file.  Past Surgical History:  Procedure Laterality Date  . HERNIA REPAIR    . KNEE SURGERY      There were no vitals filed for this visit.  Subjective Assessment - 09/12/17 0755    Subjective  Patient reports pain is very minimal. 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    Patient Stated Goals  Get back to normal.    Currently in Pain?  Yes    Pain Score  1     Pain Orientation  Right    Pain Descriptors / Indicators  Sore    Pain Type  Surgical pain    Pain Onset  1 to 4 weeks ago         [facility] - 09/12/17 0001      Assessment   Medical Diagnosis  Left knee scope medial meniscectomy.    Onset Date/Surgical Date  08/16/17    Next MD Visit  08/2017      Restrictions   Weight Bearing Restrictions  No                  [facility] Adult PT Treatment/Exercise - 09/12/17 0001      Knee/Hip Exercises: Aerobic   Stationary Bike  L4 x15 min      Knee/Hip Exercises: Standing   Terminal Knee Extension  Strengthening;Right    Terminal Knee Extension Limitations  Pink XTS 5 second hold      Knee/Hip Exercises: Supine   Short Arc Quad Sets  Strengthening;Both;2 sets;10 reps 1# weight    Single Leg Bridge  Strengthening;Right;2 sets;10 reps    Straight Leg  Raises  Strengthening;3 sets;10 reps      Knee/Hip Exercises: Sidelying   Hip ABduction  Strengthening;Right;3 sets;10 reps    Hip ADduction  Strengthening;Right;3 sets;10 reps      Knee/Hip Exercises: Prone   Straight Leg Raises  Strengthening;Right;3 sets;10 reps      Modalities   Modalities  Geologist, engineeringlectrical Stimulation      Electrical Stimulation   Electrical Stimulation Location  R knee    Electrical Stimulation Action  IFC    Electrical Stimulation Parameters  1-10 hz x15 min    Electrical Stimulation Goals  Pain      Vasopneumatic   Number Minutes Vasopneumatic   15 minutes    Vasopnuematic Location   Knee    Vasopneumatic Pressure  Medium    Vasopneumatic Temperature   34                  PT Long Term Goals - 08/25/17 1229      PT LONG TERM GOAL #1   Title  Independent with a HEP.    Time  6    Period  Weeks    Status  New      PT LONG TERM GOAL #2   Title  Walk in clinic 500 feet with a normal gait pattern.    Time  6    Period  Weeks    Status  New      PT LONG TERM GOAL #3   Title  Perform a reciprocating stair gait with pain not > 2/10.    Time  6    Period  Weeks    Status  New      PT LONG TERM GOAL #4   Title  Perform ADL's with pain not > 2-3/10.    Time  6    Period  Weeks    Status  New            Plan - 09/12/17 0818    Clinical Impression Statement  Patient able to tolerate treatment well with no increase of pain; minimal pain only noted with Single leg bridges; revert to both LE bridging for pain free movement.  Patient's other ther-ex progressed in repetition/weight with no reports of pain. Patient educated on importance of pain free knee strengthening. Patient inquired about ultrasound for pain; [place] not in plan of care. No adverse effects noted upon removal of modalities    Clinical Presentation  Stable    Clinical Decision Making  Low    Rehab Potential  Excellent    PT Frequency  2x / week    PT Duration  6 weeks    PT  Treatment/Interventions  ADLs/Self Care Home Management;Cryotherapy;Electrical Stimulation;Functional mobility training;Therapeutic activities;Neuromuscular re-education;Therapeutic exercise;Patient/family education;Manual techniques;Vasopneumatic Device    PT Next Visit Plan  Pain-free left quadricep exercises; stationary bike; vasopneumatic and electrical stimulation.    Consulted and Agree with Plan of Care  Patient       Patient will benefit from skilled therapeutic intervention in order to improve the following deficits and impairments:  Abnormal gait, Decreased activity tolerance, Increased edema, Pain  Visit Diagnosis: Acute pain of right knee     Problem List Patient Active Problem List   Diagnosis Date Noted  . History of substance abuse 08/15/2017   Guss Bunde, PT, DPT 09/12/2017, 8:32 AM  [facility] [address] [place], [place], [address] Phone: [phone]   Fax:  [phone]  Name: Craig Howe MRN: [id number] Date of Birth: 1990-10-27

## 2017-09-14 ENCOUNTER — Encounter: Payer: PRIVATE HEALTH INSURANCE | Admitting: Physical Therapy

## 2017-09-27 ENCOUNTER — Encounter: Payer: PRIVATE HEALTH INSURANCE | Admitting: Physical Therapy

## 2017-10-09 ENCOUNTER — Ambulatory Visit: Payer: BLUE CROSS/BLUE SHIELD | Attending: Orthopedic Surgery | Admitting: Physical Therapy

## 2017-10-09 ENCOUNTER — Encounter: Payer: Self-pay | Admitting: Physical Therapy

## 2017-10-09 DIAGNOSIS — M25561 Pain in right knee: Secondary | ICD-10-CM | POA: Insufficient documentation

## 2017-10-09 NOTE — Patient Instructions (Signed)
    Strengthening: Terminal Knee Extension (Supine)   With right knee over bolster, straighten knee by tightening muscles on top of thigh. Keep bottom of knee on bolster. Repeat _10___ times per set. Do ___2-3_ sets per session. Do _2-3___ sessions per day.   Knee Extension (Sitting)   Place __0-3__ pound weight on left ankle and straighten knee fully, lower slowly. Repeat _10___ times per set. Do __2-3__ sets per session. Do __2-3__ sessions per day.   Step-Down / Step-Up   Stand on stair step or __6__ inch stool. Slowly bend left leg, lowering other foot to floor. Return by straightening front leg. Repeat __10__ times per set. Do __2-3__ sets per session. Do __2-3__ sessions per day.  stretch 30 sec x5-10 2-3 x daily     Strengthening: Hip Abduction (Side-Lying)   Tighten muscles on front of left thigh, then lift leg __5__ inches from surface, keeping knee locked.  Repeat __10__ times per set. Do __2__ sets per session. Do _2___ sessions per day.

## 2017-10-09 NOTE — Therapy (Addendum)
[facility] [place], [place], [address] Phone: [phone]   Fax:  [phone]  Physical Therapy Treatment  Patient Details  Name: Craig Howe MRN: [id number] Date of Birth: 15-Mar-1991 Referring Provider: Earlie Server MD   Encounter Date: 10/09/2017  PT End of Session - 10/09/17 0927    Visit Number  5    Number of Visits  12    Date for PT Re-Evaluation  10/06/17    PT Start Time  0901    PT Stop Time  0938    PT Time Calculation (min)  37 min    Activity Tolerance  Patient tolerated treatment well    Behavior During Therapy  [facility] for tasks assessed/performed       History reviewed. No pertinent past medical history.  Past Surgical History:  Procedure Laterality Date  . HERNIA REPAIR    . KNEE SURGERY      There were no vitals filed for this visit.  Subjective Assessment - 10/09/17 0907    Subjective  Patient arrived with no pain in right knee reported and doing well overall, went to MD and was released back to work.     Patient Stated Goals  Get back to normal.    Currently in Pain?  No/denies                No data recorded       [facility] Adult PT Treatment/Exercise - 10/09/17 0001      Knee/Hip Exercises: Aerobic   Stationary Bike  L4 x15 min      Knee/Hip Exercises: Standing   Other Standing Knee Exercises  up/down stairs x2 no discomfort      Electrical Stimulation   Electrical Stimulation Location  R knee    Electrical Stimulation Action  IFC    Electrical Stimulation Parameters  1-_0  x40mn    Electrical Stimulation Goals  Other (comment)             PT Education - 10/09/17 [id number]   Education provided  Yes    Education Details  HEP    Person(s) Educated  Patient    Methods  Explanation;Demonstration    Comprehension  Verbalized understanding;Returned demonstration          PT Long Term Goals - 10/09/17 0915      PT LONG TERM GOAL #1   Title  Independent  with a HEP.    Time  6    Period  Weeks    Status  Achieved 10/09/17      PT LONG TERM GOAL #2   Title  Walk in clinic 500 feet with a normal gait pattern.    Time  6    Period  Weeks    Status  Achieved 10/09/17 per reported      PT LONG TERM GOAL #3   Title  Perform a reciprocating stair gait with pain not > 2/10.    Time  6    Period  Weeks    Status  Achieved 10/09/17       PT LONG TERM GOAL #4   Title  Perform ADL's with pain not > 2-3/10.    Time  6    Period  Weeks    Status  Achieved per reported 10/09/17            Plan - 10/09/17 0931    Clinical Impression Statement  Patient has met all current goals today. Patient feels 85%  overall improvement. No pain reported today, and has full strength and ROM in right knee. HEP given to patient today. Patient able to perform stairs with a reciprocationg gait pattern. Patient DC from therapy today per PT.     Rehab Potential  Excellent    PT Frequency  2x / week    PT Duration  6 weeks    PT Treatment/Interventions  ADLs/Self Care Home Management;Cryotherapy;Electrical Stimulation;Functional mobility training;Therapeutic activities;Neuromuscular re-education;Therapeutic exercise;Patient/family education;Manual techniques;Vasopneumatic Device    PT Next Visit Plan  DC    Consulted and Agree with Plan of Care  Patient       Patient will benefit from skilled therapeutic intervention in order to improve the following deficits and impairments:  Abnormal gait, Decreased activity tolerance, Increased edema, Pain  Visit Diagnosis: Acute pain of right knee     Problem List Patient Active Problem List   Diagnosis Date Noted  . History of substance abuse 08/15/2017    Phillips Climes, PTA 10/09/2017, 9:39 AM  [facility] [place], [place], [address] Phone: [phone]   Fax:  [phone]  Name: Craig Howe MRN: [id number] Date of Birth:  Aug 24, 1990  PHYSICAL THERAPY DISCHARGE SUMMARY  Visits from Start of Care: 5.  Current functional level related to goals / functional outcomes: See above.   Remaining deficits: Goals met.   Education / Equipment: HEP Plan: Patient agrees to discharge.  Patient goals were met. Patient is being discharged due to meeting the stated rehab goals.  ?????         Mali Applegate MPT

## 2017-11-28 ENCOUNTER — Ambulatory Visit: Payer: BLUE CROSS/BLUE SHIELD | Admitting: Physician Assistant

## 2017-11-29 ENCOUNTER — Encounter: Payer: Self-pay | Admitting: Physician Assistant

## 2018-04-05 ENCOUNTER — Emergency Department (HOSPITAL_COMMUNITY)
Admission: EM | Admit: 2018-04-05 | Discharge: 2018-04-05 | Disposition: A | Discharge status: Home / Self Care | Payer: BLUE CROSS/BLUE SHIELD | Attending: Emergency Medicine | Admitting: Emergency Medicine

## 2018-04-05 DIAGNOSIS — Z79899 Other long term (current) drug therapy: Secondary | ICD-10-CM | POA: Diagnosis not present

## 2018-04-05 DIAGNOSIS — F17200 Nicotine dependence, unspecified, uncomplicated: Secondary | ICD-10-CM | POA: Diagnosis not present

## 2018-04-05 DIAGNOSIS — F17220 Nicotine dependence, chewing tobacco, uncomplicated: Secondary | ICD-10-CM | POA: Insufficient documentation

## 2018-04-05 DIAGNOSIS — Z87898 Personal history of other specified conditions: Secondary | ICD-10-CM | POA: Insufficient documentation

## 2018-04-05 DIAGNOSIS — Z046 Encounter for general psychiatric examination, requested by authority: Secondary | ICD-10-CM | POA: Diagnosis not present

## 2018-04-05 DIAGNOSIS — F320 Major depressive disorder, single episode, mild: Secondary | ICD-10-CM | POA: Diagnosis not present

## 2018-04-05 DIAGNOSIS — R45851 Suicidal ideations: Secondary | ICD-10-CM | POA: Diagnosis not present

## 2018-04-05 DIAGNOSIS — F1911 Other psychoactive substance abuse, in remission: Secondary | ICD-10-CM

## 2018-04-05 LAB — CBC WITH DIFFERENTIAL/PLATELET
BASOS ABS: 0 10*3/uL (ref 0.0–0.1)
BASOS PCT: 0 %
EOS ABS: 0 10*3/uL (ref 0.0–0.7)
Eosinophils Relative: 1 %
HEMATOCRIT: 48 % (ref 39.0–52.0)
Hemoglobin: 16.7 g/dL (ref 13.0–17.0)
Lymphocytes Relative: 30 %
Lymphs Abs: 1.5 10*3/uL (ref 0.7–4.0)
MCH: 30.9 pg (ref 26.0–34.0)
MCHC: 34.8 g/dL (ref 30.0–36.0)
MCV: 88.9 fL (ref 78.0–100.0)
MONO ABS: 0.4 10*3/uL (ref 0.1–1.0)
Monocytes Relative: 9 %
NEUTROS PCT: 60 %
Neutro Abs: 3 10*3/uL (ref 1.7–7.7)
Platelets: 197 10*3/uL (ref 150–400)
RBC: 5.4 MIL/uL (ref 4.22–5.81)
RDW: 12.5 % (ref 11.5–15.5)
WBC: 4.9 10*3/uL (ref 4.0–10.5)

## 2018-04-05 LAB — COMPREHENSIVE METABOLIC PANEL
ALBUMIN: 4.5 g/dL (ref 3.5–5.0)
ALK PHOS: 34 U/L — AB (ref 38–126)
ALT: 33 U/L (ref 0–44)
AST: 28 U/L (ref 15–41)
Anion gap: 10 (ref 5–15)
BUN: 17 mg/dL (ref 6–20)
CO2: 26 mmol/L (ref 22–32)
CREATININE: 0.91 mg/dL (ref 0.61–1.24)
Calcium: 9.5 mg/dL (ref 8.9–10.3)
Chloride: 100 mmol/L (ref 98–111)
GFR calc Af Amer: >60 mL/min (ref >60)
GFR calc non Af Amer: >60 mL/min (ref >60)
GLUCOSE: 101 mg/dL — AB (ref 70–99)
Potassium: 4.2 mmol/L (ref 3.5–5.1)
SODIUM: 136 mmol/L (ref 135–145)
Total Bilirubin: 0.8 mg/dL (ref 0.3–1.2)
Total Protein: 7.7 g/dL (ref 6.5–8.1)

## 2018-04-05 LAB — RAPID URINE DRUG SCREEN, HOSP PERFORMED
Amphetamines: NOT DETECTED
BARBITURATES: NOT DETECTED
Benzodiazepines: NOT DETECTED
Cocaine: NOT DETECTED
Opiates: NOT DETECTED
TETRAHYDROCANNABINOL: NOT DETECTED

## 2018-04-05 LAB — ETHANOL: Alcohol, Ethyl (B): <10 mg/dL (ref <10)

## 2018-04-05 LAB — VALPROIC ACID LEVEL: VALPROIC ACID LVL: 87 ug/mL (ref 50.0–100.0)

## 2018-04-05 MED ORDER — NICOTINE 21 MG/24HR TD PT24
21.0000 mg | MEDICATED_PATCH | Freq: Every day | TRANSDERMAL | Status: DC
  Administered 2018-04-05: 21 mg via TRANSDERMAL
  Filled 2018-04-05: qty 1

## 2018-04-05 MED ORDER — KETOCONAZOLE 2 % EX CREA: 1.0000 "application " | TOPICAL_CREAM | Freq: Every day | CUTANEOUS | 0 refills | Status: DC

## 2018-04-05 NOTE — BH Assessment (Signed)
Assessment Note  Craig Howe is an 27 y.o. male. Pt was IVCd at [facility] for SI. Pt currently denies SI/HI and AVH. Pt states he was prescribed Depakote and Abilify. Pt states his medication was doubled and he began to "feel funny." The Pt states he began to have suicidal thoughts last night. Pt denies previous SI thoughts and felt it was from the medication adjustments. Pt reports being admitted into multiple SA programs but this was first time being successful at [facility]. Pt states he has been clean for 60 days.  Pt feels he is need of a intensive outpatient SA programs.   Dr. Sharma Covert and Jacki Cones, NP recommend D/C and SA resources.   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  Past Surgical History:  Procedure Laterality Date  . HERNIA REPAIR    . KNEE SURGERY      Family History: No family history on file.  Social History:  reports that he has been smoking. His smokeless tobacco use includes chew. He reports that he does not drink alcohol or use drugs.  Additional Social History:  Alcohol / Drug Use Pain Medications: please see mar Prescriptions: please see mar Over the Counter: please see mar History of alcohol / drug use?: Yes Longest period of sobriety (when/how long): unknown Negative Consequences of Use: Financial, Legal, Personal relationships, Work / School Substance #1 Name of Substance 1: heroin 1 - Age of First Use: unknown 1 - Amount (size/oz): unknown 1 - Frequency: 55 days ago 1 - Duration: 55 days ago 1 - Last Use / Amount: 55 days ago  CIWA: CIWA-Ar BP: (!) 147/94 Pulse Rate: (!) 103 COWS:    Allergies: No Known Allergies  Home Medications:  (Not in a hospital admission)  OB/GYN Status:  No LMP for male patient.  General Assessment Data Location of Assessment: [facility] ED TTS Assessment: In system Is this a Tele or Face-to-Face Assessment?: Face-to-Face Is this an Initial Assessment  or a Re-assessment for this encounter?: Initial Assessment Patient Accompanied by:: Parent Language Other than English: No Living Arrangements: Other (Comment) What gender do you identify as?: Male Marital status: Single Maiden name: NA Pregnancy Status: No Living Arrangements: Other (Comment) Can pt return to current living arrangement?: Yes Admission Status: Involuntary Petitioner: Other Is patient capable of signing voluntary admission?: No Referral Source: Self/Family/Friend Insurance type: Geophysicist/field seismologist Care Plan Living Arrangements: Other (Comment) Legal Guardian: Other:(self) Name of Psychiatrist: NA Name of Therapist: NA  Education Status Is patient currently in school?: No Is the patient employed, unemployed or receiving disability?: Employed  Risk to self with the past 6 months Suicidal Ideation: No-Not Currently/Within Last 6 Months Has patient been a risk to self within the past 6 months prior to admission? : No Suicidal Intent: No Has patient had any suicidal intent within the past 6 months prior to admission? : No Is patient at risk for suicide?: No Suicidal Plan?: No Has patient had any suicidal plan within the past 6 months prior to admission? : No Access to Means: No What has been your use of drugs/alcohol within the last 12 months?: heroin Previous Attempts/Gestures: No How many times?: 0 Other Self Harm Risks: NA Triggers for Past Attempts: None known Intentional Self Injurious Behavior: None Family Suicide History: No Recent stressful life event(s): Other (Comment)(SA/ D/C from SA program) Persecutory voices/beliefs?: No Depression: No Depression Symptoms: (pt denies) Substance abuse  history and/or treatment for substance abuse?: Yes Suicide prevention information given to non-admitted patients: Not applicable  Risk to Others within the past 6 months Homicidal Ideation: No Does patient have any lifetime risk of violence toward  others beyond the six months prior to admission? : No Thoughts of Harm to Others: No Current Homicidal Intent: No Current Homicidal Plan: No Access to Homicidal Means: No Identified Victim: NA History of harm to others?: No Assessment of Violence: None Noted Violent Behavior Description: NA Does patient have access to weapons?: No Criminal Charges Pending?: No Does patient have a court date: No Is patient on probation?: No  Psychosis Hallucinations: None noted Delusions: None noted  Mental Status Report Appearance/Hygiene: Unremarkable Eye Contact: Fair Motor Activity: Freedom of movement Speech: Logical/coherent Level of Consciousness: Alert Mood: Euthymic Affect: Appropriate to circumstance Anxiety Level: Minimal Thought Processes: Coherent, Relevant Judgement: Unimpaired Orientation: Person, Place, Time, Situation Obsessive Compulsive Thoughts/Behaviors: None  Cognitive Functioning Concentration: Normal Memory: Recent Intact, Remote Intact Is patient IDD: No Insight: Fair Impulse Control: Fair Appetite: Fair Have you had any weight changes? : No Change Sleep: No Change Total Hours of Sleep: 8 Vegetative Symptoms: None  ADLScreening [facility] Assessment Services) Patient's cognitive ability adequate to safely complete daily activities?: Yes Patient able to express need for assistance with ADLs?: Yes Independently performs ADLs?: Yes (appropriate for developmental age)  Prior Inpatient Therapy Prior Inpatient Therapy: Yes Prior Therapy Dates: multiple Prior Therapy Facilty/Provider(s): SA programs Reason for Treatment: SA  Prior Outpatient Therapy Prior Outpatient Therapy: No Does patient have an ACCT team?: No Does patient have Intensive In-House Services?  : No Does patient have [facility] services? : No Does patient have P4CC services?: No  ADL Screening (condition at time of admission) Patient's cognitive ability adequate to safely complete daily  activities?: Yes Is the patient deaf or have difficulty hearing?: No Does the patient have difficulty seeing, even when wearing glasses/contacts?: No Does the patient have difficulty concentrating, remembering, or making decisions?: No Patient able to express need for assistance with ADLs?: Yes Does the patient have difficulty dressing or bathing?: No Independently performs ADLs?: Yes (appropriate for developmental age)       Abuse/Neglect Assessment (Assessment to be complete while patient is alone) Abuse/Neglect Assessment Can Be Completed: Yes Physical Abuse: Denies Verbal Abuse: Denies Sexual Abuse: Denies Exploitation of patient/patient's resources: Denies     Merchant navy officerAdvance Directives (For Healthcare) Does Patient Have a Medical Advance Directive?: No Would patient like information on creating a medical advance directive?: No - Patient declined          Disposition:  Disposition Initial Assessment Completed for this Encounter: Yes Patient referred to: Other (Comment)(SA resources and peer support)  On Site Evaluation by:   Reviewed with Physician:    Wolfgang PhoenixLevette,Shaunta Oncale D 04/05/2018 3:00 PM

## 2018-04-05 NOTE — ED Triage Notes (Signed)
Patient states his meds have been changed recently-states his Zoloft was replaced with Abilify and his Depakote was increased-states last night he was having some "crazy thoughts"-states he is not suicidal at this time-

## 2018-04-05 NOTE — ED Notes (Signed)
Bed: WA27 Expected date:  Expected time:  Means of arrival:  Comments: 

## 2018-04-05 NOTE — ED Notes (Signed)
[facility], patient will not be returning to their facility due to being "psychiatrically unstable"-states she has spoken to his parents and gave them resources [facility]

## 2018-04-05 NOTE — Patient Outreach (Signed)
ED Peer Support Specialist Patient Intake (Complete at intake & 30-60 Day Follow-up)  Name: Craig Howe  MRN: [id number]  Age: 27 y.o.   Date of Admission: 04/05/2018  Intake: Initial Comments:      Primary Reason Admitted: Patient states his meds have been changed recently-states his Zoloft was replaced with Abilify and his Depakote was increased-states last night he was having some "crazy thoughts"-states he is not suicidal at this time-  Lab values: Alcohol/ETOH: Negative Positive UDS? No Amphetamines: No Barbiturates: No Benzodiazepines: No Cocaine: No Opiates: No Cannabinoids: No  Demographic information: Gender: Male Ethnicity: White Marital Status: Single Insurance Status: Facilities managerrivate Insurance Receives non-medical governmental assistance (Work Engineer, agriculturalirst/Welfare, Sales executivefood stamps, etc.: Yes(Short term Statisticiandis ) Lives with: Partner/Spouse Living situation: House/Apartment  Reported Patient History: Patient reported health conditions: ADD/ADHD, Anxiety disorders, Bipolar disorder, Depression Patient aware of HIV and hepatitis status: No  In past year, has patient visited ED for any reason? No  Number of ED visits:    Reason(s) for visit:    In past year, has patient been hospitalized for any reason? No  Number of hospitalizations:    Reason(s) for hospitalization:    In past year, has patient been arrested? No  Number of arrests:    Reason(s) for arrest:    In past year, has patient been incarcerated? No  Number of incarcerations:    Reason(s) for incarceration:    In past year, has patient received medication-assisted treatment? No  In past year, patient received the following treatments: Residential treatment (non-hospital), Other (comment)([facility] )  In past year, has patient received any harm reduction services? No  Did this include any of the following?    In past year, has patient received care from a mental health provider for diagnosis other than  SUD? No  In past year, is this first time patient has overdosed? No  Number of past overdoses:    In past year, is this first time patient has been hospitalized for an overdose? No  Number of hospitalizations for overdose(s):    Is patient currently receiving treatment for a mental health diagnosis? No  Patient reports experiencing difficulty participating in SUD treatment: No    Most important reason(s) for this difficulty?    Has patient received prior services for treatment? No  In past, patient has received services from following agencies:    Plan of Care:  Suggested follow up at these agencies/treatment centers: (Pt stated that he wants to see a Psy Dr to get his medication regulated an seek housing  )  Other information: CPSS and was able to understand what brought Pt into the ED. CPSS was made aware of the situation that lead up to Pt being brought to the ED. CPSS John processed with Pt and his mother to better assist Pt and to help get Pt set up with Outpatient Behavior health. CPSS. CPSS John and I processed with Pt an offered several options that Pt and Mother were not agreeing on. CPSS Jonny RuizJohn and Arlys JohnBrian gave Pt resources that maybe helpful. CPSS left contact information for Pt to stay in contact in the community.    Arlys JohnBrian Tenelle Andreason, CPSS  04/05/2018 3:17 PM

## 2018-04-05 NOTE — ED Notes (Signed)
Pt waiting for peer support and then to be d/c. Parents remain at bedside.

## 2018-04-05 NOTE — ED Triage Notes (Signed)
Per IVC paperwork, patient is a client at [facility]-states he is manic having racing thought-expressing SI with plan-plan is to hang self

## 2018-04-05 NOTE — ED Provider Notes (Signed)
[facility]-EMERGENCY DEPT Provider Note   CSN: [id number] Arrival date & time: 04/05/18  1104     History   Chief Complaint Chief Complaint  Patient presents with  . IVC    HPI Craig Howe is a 27 y.o. 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  Patient reports that he is currently in a program at [facility].  He notes that his medications have been recently changed from Zoloft to Abilify and then to Depakote.  Here he notes a recent increase in his Depakote prescription this week.  He reports that he thinks this is making him have mood swings.  He reports that last night he awoke and had a strong thoughts of harming himself.  He notes he had a plan to hang himself and did have the means of this.  He called his mother at 2 AM and told her of this.  She is at bedside and supportive.  Patient was then IV seated by [facility] staff for this.  He notes his suicidal ideation has now resolved.  He denies any homicidal ideation.  He denies any visual or auditory hallucinations.  Patient does report that he has had a hard time sleeping in the last several days.  He denies any alcohol or drug use.  He is requesting a nicotine patch for tobacco abuse.  Patient denies any associated fever, chills, chest pain, shortness of breath, abdominal pain, nausea/vomiting/diarrhea, arthralgias, myalgias, urinary symptoms.  HPI  No past medical history on file.  Patient Active Problem List   Diagnosis Date Noted  . History of substance abuse 08/15/2017    Past Surgical History:  Procedure Laterality Date  . HERNIA REPAIR    . KNEE SURGERY          Home Medications    Prior to Admission medications   Medication Sig Start Date End Date Taking? Authorizing Provider  ARIPiprazole (ABILIFY) 10 MG tablet Take 10 mg by mouth daily.  03/29/18   [provider]  atomoxetine  (STRATTERA) 80 MG capsule Take 80 mg by mouth every morning. 03/08/18   [provider]  cloNIDine (CATAPRES) 0.1 MG tablet Take 0.1 mg by mouth 2 (two) times daily.  08/03/17   [provider]  divalproex (DEPAKOTE) 500 MG DR tablet Take 500 mg by mouth 3 (three) times daily. 03/08/18   [provider]  Multiple Vitamins-Minerals ([facility] DAILY MULTIPLE FOR MEN) TABS Take 1 tablet by mouth every morning. 03/13/18   [provider]  Omega-3 Fatty Acids ([employer] FISH OIL) 1000 MG CAPS Take 3 capsules by mouth every morning. 03/13/18   [provider]  omeprazole (PRILOSEC) 20 MG capsule Take 20 mg by mouth 2 (two) times daily. 03/08/18   [provider]  QUEtiapine (SEROQUEL) 200 MG tablet TAKE 1 TABLET BY MOUTH EVERY EVENING AT BEDTIME 03/08/18   [provider]  sertraline (ZOLOFT) 50 MG tablet Take 50 mg by mouth every morning. 03/08/18   [provider]  ZUBSOLV 2.9-0.71 MG SUBL PLACE 1 TABLET UNDER TONGUE TWICE DIALY 08/12/17   [provider]    Family History No family history on file.  Social History Social History   Tobacco Use  . Smoking status: Current Some Day Smoker  . Smokeless tobacco: Current User    Types: Chew  Substance Use Topics  . Alcohol use: No    Frequency:  Never  . Drug use: No     Allergies   Patient has no known allergies.   Review of Systems Review of Systems  All other systems reviewed and are negative.    Physical Exam Updated Vital Signs BP (!) 147/94   Pulse (!) 103   Temp 98.2 F (36.8 C) (Oral)   Resp 18   SpO2 94%   Physical Exam  Constitutional: He appears well-developed and well-nourished.  HENT:  Head: Normocephalic and atraumatic.  Right Ear: External ear normal.  Left Ear: External ear normal.  Nose: Nose normal.  Mouth/Throat: Uvula is midline, oropharynx is clear and moist and mucous membranes are normal. No uvula swelling. No posterior oropharyngeal edema.  No tonsillar exudate.  Eyes: Pupils are equal, round, and reactive to light. Right eye exhibits no discharge. Left eye exhibits no discharge. No scleral icterus.  Neck: Trachea normal. Neck supple. No spinous process tenderness present. No neck rigidity. Normal range of motion present.  Cardiovascular: Normal rate, regular rhythm and intact distal pulses.  No murmur heard. Pulses:      Radial pulses are 2+ on the right side, and 2+ on the left side.       Dorsalis pedis pulses are 2+ on the right side, and 2+ on the left side.       Posterior tibial pulses are 2+ on the right side, and 2+ on the left side.  No lower extremity swelling or edema. Calves symmetric in size bilaterally.  Pulmonary/Chest: Effort normal and breath sounds normal. He exhibits no tenderness.  Abdominal: Soft. Bowel sounds are normal. There is no tenderness. There is no rebound and no guarding.  Musculoskeletal: He exhibits no edema.  Lymphadenopathy:    He has no cervical adenopathy.  Neurological: He is alert.  Skin: Skin is warm and dry. No rash noted. He is not diaphoretic.  On patients back is what appears to be tinea versicolor  Psychiatric: He has a normal mood and affect.  Nursing note and vitals reviewed.  ED Treatments / Results  Labs (all labs ordered are listed, but only abnormal results are displayed) Labs Reviewed  COMPREHENSIVE METABOLIC PANEL - Abnormal; Notable for the following components:      Result Value   Glucose, Bld 101 (*)    Alkaline Phosphatase 34 (*)    All other components within normal limits  ETHANOL  RAPID URINE DRUG SCREEN, HOSP PERFORMED  CBC WITH DIFFERENTIAL/PLATELET  VALPROIC ACID LEVEL    EKG None  Radiology No results found.  Procedures Procedures (including critical care time)  Medications Ordered in ED Medications  nicotine (NICODERM CQ - dosed in mg/24 hours) patch 21 mg (has no administration in time range)     Initial Impression / Assessment and Plan  / ED Course  I have reviewed the triage vital signs and the nursing notes.  Pertinent labs & imaging results that were available during my care of the patient were reviewed by me and considered in my medical decision making (see chart for details).     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  Patient reports she has recently had multiple changes medications as well as a doubling of his Depakote.  He reports yesterday he had a episode where he had suicidal thoughts of hanging himself.  He denies any thoughts of this today.  [facility] who placed IVC paperwork.  Patient denies any homicidal ideation, visual or auditory hallucinations.  No alcohol or drug use.  He has no physical complaints at this time.  He was noted to have a tinea versicolor-like rash on exam.  No signs of anaphylaxis.  Do not suspect this is related to drug allergy.  Will treat with ketoconazole cream.  Patient's labs reviewed and patient was medically cleared.  TTS consult placed.  Will hold on home medications given patient's multiple problems with changes recently.  Nicotine patch ordered.  Patient clear by TTS.  I agree with this.  Peers support and contact to try to help place the patient.  Final Clinical Impressions(s) / ED Diagnoses   Final diagnoses:  Involuntary commitment    ED Discharge Orders    None       Princella Pellegrini 04/05/18 1509    Jacalyn Lefevre, MD 04/05/18 1520

## 2018-06-01 ENCOUNTER — Encounter: Payer: Self-pay | Admitting: Physician Assistant

## 2018-06-01 ENCOUNTER — Ambulatory Visit (INDEPENDENT_AMBULATORY_CARE_PROVIDER_SITE_OTHER): Payer: BLUE CROSS/BLUE SHIELD | Admitting: Physician Assistant

## 2018-06-01 VITALS — BP 147/91 | HR 81 | Temp 98.0°F | Ht 74.0 in | Wt 242.0 lb

## 2018-06-01 DIAGNOSIS — F1911 Other psychoactive substance abuse, in remission: Secondary | ICD-10-CM | POA: Diagnosis not present

## 2018-06-01 DIAGNOSIS — B360 Pityriasis versicolor: Secondary | ICD-10-CM | POA: Diagnosis not present

## 2018-06-01 DIAGNOSIS — K219 Gastro-esophageal reflux disease without esophagitis: Secondary | ICD-10-CM | POA: Insufficient documentation

## 2018-06-01 DIAGNOSIS — F4310 Post-traumatic stress disorder, unspecified: Secondary | ICD-10-CM | POA: Insufficient documentation

## 2018-06-01 MED ORDER — KETOCONAZOLE 2 % EX CREA: 1.0000 "application " | TOPICAL_CREAM | Freq: Every day | CUTANEOUS | 2 refills | Status: DC

## 2018-06-01 MED ORDER — OMEPRAZOLE 20 MG PO CPDR: 20.0000 mg | DELAYED_RELEASE_CAPSULE | Freq: Two times a day (BID) | ORAL | 11 refills | Status: DC

## 2018-06-01 MED ORDER — ICOSAPENT ETHYL 1 G PO CAPS: 1.0000 g | ORAL_CAPSULE | Freq: Two times a day (BID) | ORAL | 11 refills | Status: DC

## 2018-06-01 NOTE — Patient Instructions (Signed)
Your provider wants you to schedule an appointment with a Psychologist/Psychiatrist. The following list of offices requires the patient to call and make their own appointment, as there is information they need that only you can provide. Please feel free to choose form the following providers:  [facility]   [phone] Crisis Recovery in [place] [phone]  [facility]  [phone]   [address] [place], [place]  (Scheduled through [facility]) Must call and do an interview for appointment. Sees Children / Accepts Medicaid  [facility]    [phone]  [address], [address]    [place], [place]       [facility]  [phone] [address] [place], [place]  Evaluates for Autism but does not treat it Sees Children / Accepts Medicaid  [facility]    [phone] [address], [address]   [place], [place] Medication management, substance abuse, bipolar, grief, family, marriage, OCD, anxiety, PTSD Sees children / Accepts Medicaid  [facility]    [phone] [address] [place], [place] Sees children / Accepts Medicaid  [facility]  [phone] [address] [place], [place]   Dr Akinlayo     [phone] [address] [place], [place]  Sees ADD & ADHD for treatment Accepts Medicaid  [facility]  [phone] [address] [place], [place] Evaluates for Autism Accepts Medicaid  [facility]  [phone] [address] [place], [place]  Does Adult ADD evaluations Does not accept Medicaid  [facility]   [phone] [address]   , [place] Uses animal therapy  Sees children as young as 3 years old Accepts Medicaid  [facility]     [phone]    [address]  , [place] [address] Sees children Accepts Medicaid  

## 2018-06-01 NOTE — Progress Notes (Signed)
BP (!) 147/91   Pulse 81   Temp 98 F (36.7 C) (Oral)   Ht 6\' 2"  (1.88 m)   Wt 242 lb (109.8 kg)   BMI 31.07 kg/m    Subjective:    Patient ID: Craig Howe, male    DOB: 1991-07-18, 27 y.o.   MRN: [id number]  HPI: Craig Howe is a 27 y.o. male presenting on 06/01/2018 for No chief complaint on file. This patient comes in for periodic recheck on his chronic medical conditions.  He is recovering from substance abuse, he reports he is doing very well and still engaged in a good program and seen psychiatry by telemetry medicine.  He also has a diagnosis of PTSD, GERD, tinea versicolor.  He had tried topical Nizoral.  But it is not helping very much.  We discussed the possibility of using oral medications.  He will have the labs sent to [place] so that we can check his kidney functions.  And we will then send in something like Nizoral.   History reviewed. No pertinent past medical history. Relevant past medical, surgical, family and social history reviewed and updated as indicated. Interim medical history since our last visit reviewed. Allergies and medications reviewed and updated. DATA REVIEWED: CHART IN EPIC  Family History reviewed for pertinent findings.  Review of Systems  Constitutional: Negative.  Negative for appetite change and fatigue.  Eyes: Negative for pain and visual disturbance.  Respiratory: Negative.  Negative for cough, chest tightness, shortness of breath and wheezing.   Cardiovascular: Negative.  Negative for chest pain, palpitations and leg swelling.  Gastrointestinal: Negative.  Negative for abdominal pain, diarrhea, nausea and vomiting.  Genitourinary: Negative.   Skin: Positive for rash. Negative for color change.  Neurological: Negative.  Negative for weakness, numbness and headaches.  Psychiatric/Behavioral: Negative.     Allergies as of 06/01/2018   No Known Allergies     Medication List        Accurate as of 06/01/18  6:06 PM. Always use  your most recent med list.          ARIPiprazole 10 MG tablet Commonly known as:  ABILIFY Take 10 mg by mouth daily.   atomoxetine 80 MG capsule Commonly known as:  STRATTERA Take 80 mg by mouth every morning.   cloNIDine 0.1 mg/24hr patch Commonly known as:  CATAPRES - Dosed in mg/24 hr Place 0.1 mg onto the skin once a week.   [employer] DAILY MULTIPLE FOR MEN Tabs Take 1 tablet by mouth every morning.   [employer] FISH OIL 1000 MG Caps Take 3 capsules by mouth every morning.   divalproex 250 MG 24 hr tablet Commonly known as:  DEPAKOTE ER Take 750 mg by mouth 2 (two) times daily.   divalproex 500 MG DR tablet Commonly known as:  DEPAKOTE Take 500 mg by mouth daily at 12 noon.   Icosapent Ethyl 1 g Caps Take 1 capsule (1 g total) by mouth 2 (two) times daily.   ketoconazole 2 % cream Commonly known as:  NIZORAL Apply 1 application topically daily. Apply to affected area nightly for 2 weeks   omeprazole 20 MG capsule Commonly known as:  PRILOSEC Take 1 capsule (20 mg total) by mouth 2 (two) times daily.   QUEtiapine 200 MG tablet Commonly known as:  SEROQUEL TAKE 1 TABLET BY MOUTH EVERY EVENING AT BEDTIME          Objective:    BP (!) 147/91  Pulse 81   Temp 98 F (36.7 C) (Oral)   Ht 6\' 2"  (1.88 m)   Wt 242 lb (109.8 kg)   BMI 31.07 kg/m   No Known Allergies  Wt Readings from Last 3 Encounters:  06/01/18 242 lb (109.8 kg)  08/15/17 191 lb 6.4 oz (86.8 kg)    Physical Exam  Constitutional: He appears well-developed and well-nourished. No distress.  HENT:  Head: Normocephalic and atraumatic.  Eyes: Pupils are equal, round, and reactive to light. Conjunctivae and EOM are normal.  Cardiovascular: Normal rate, regular rhythm and normal heart sounds.  Pulmonary/Chest: Effort normal and breath sounds normal. No respiratory distress.  Skin: Skin is warm and dry. Rash noted. Rash is macular.  White and pink lesions  Psychiatric: He has a normal mood and affect.  His behavior is normal.  Nursing note and vitals reviewed.   Results for orders placed or performed during the hospital encounter of 04/05/18  Comprehensive metabolic panel  Result Value Ref Range   Sodium 136 135 - 145 mmol/L   Potassium 4.2 3.5 - 5.1 mmol/L   Chloride 100 98 - 111 mmol/L   CO2 26 22 - 32 mmol/L   Glucose, Bld 101 (H) 70 - 99 mg/dL   BUN 17 6 - 20 mg/dL   Creatinine, Ser 0.980.91 0.61 - 1.24 mg/dL   Calcium 9.5 8.9 - 11.910.3 mg/dL   Total Protein 7.7 6.5 - 8.1 g/dL   Albumin 4.5 3.5 - 5.0 g/dL   AST 28 15 - 41 U/L   ALT 33 0 - 44 U/L   Alkaline Phosphatase 34 (L) 38 - 126 U/L   Total Bilirubin 0.8 0.3 - 1.2 mg/dL   GFR calc non Af Amer >60 >60 mL/min   GFR calc Af Amer >60 >60 mL/min   Anion gap 10 5 - 15  Ethanol  Result Value Ref Range   Alcohol, Ethyl (B) <10 <10 mg/dL  Urine rapid drug screen (hosp performed)  Result Value Ref Range   Opiates NONE DETECTED NONE DETECTED   Cocaine NONE DETECTED NONE DETECTED   Benzodiazepines NONE DETECTED NONE DETECTED   Amphetamines NONE DETECTED NONE DETECTED   Tetrahydrocannabinol NONE DETECTED NONE DETECTED   Barbiturates NONE DETECTED NONE DETECTED  CBC with Diff  Result Value Ref Range   WBC 4.9 4.0 - 10.5 K/uL   RBC 5.40 4.22 - 5.81 MIL/uL   Hemoglobin 16.7 13.0 - 17.0 g/dL   HCT 14.748.0 82.939.0 - 56.252.0 %   MCV 88.9 78.0 - 100.0 fL   MCH 30.9 26.0 - 34.0 pg   MCHC 34.8 30.0 - 36.0 g/dL   RDW 13.012.5 86.511.5 - 78.415.5 %   Platelets 197 150 - 400 K/uL   Neutrophils Relative % 60 %   Neutro Abs 3.0 1.7 - 7.7 K/uL   Lymphocytes Relative 30 %   Lymphs Abs 1.5 0.7 - 4.0 K/uL   Monocytes Relative 9 %   Monocytes Absolute 0.4 0.1 - 1.0 K/uL   Eosinophils Relative 1 %   Eosinophils Absolute 0.0 0.0 - 0.7 K/uL   Basophils Relative 0 %   Basophils Absolute 0.0 0.0 - 0.1 K/uL  Valproic acid level  Result Value Ref Range   Valproic Acid Lvl 87 50.0 - 100.0 ug/mL      Assessment & Plan:   1. History of substance abuse (HCC) -  cloNIDine (CATAPRES - DOSED IN MG/24 HR) 0.1 mg/24hr patch; Place 0.1 mg onto the skin once a week. -  divalproex (DEPAKOTE ER) 250 MG 24 hr tablet; Take 750 mg by mouth 2 (two) times daily.  2. PTSD (post-traumatic stress disorder) - divalproex (DEPAKOTE ER) 250 MG 24 hr tablet; Take 750 mg by mouth 2 (two) times daily.  3. Gastroesophageal reflux disease without esophagitis - omeprazole (PRILOSEC) 20 MG capsule; Take 1 capsule (20 mg total) by mouth 2 (two) times daily.  Dispense: 30 capsule; Refill: 11  4. Tinea versicolor - ketoconazole (NIZORAL) 2 % cream; Apply 1 application topically daily. Apply to affected area nightly for 2 weeks  Dispense: 60 g; Refill: 2 Once labs come over can do an oral medication   Continue all other maintenance medications as listed above.  Follow up plan: No follow-ups on file.  Educational handout given for survey  Remus Loffler PA-C [facility] [address]  [place], [place] [address] [phone]   06/01/2018, 6:06 PM

## 2018-06-11 ENCOUNTER — Other Ambulatory Visit: Payer: Self-pay | Admitting: *Deleted

## 2018-06-11 DIAGNOSIS — K219 Gastro-esophageal reflux disease without esophagitis: Secondary | ICD-10-CM

## 2018-06-11 MED ORDER — OMEPRAZOLE 20 MG PO CPDR: 20.0000 mg | DELAYED_RELEASE_CAPSULE | Freq: Two times a day (BID) | ORAL | 3 refills | Status: DC

## 2018-07-23 ENCOUNTER — Other Ambulatory Visit: Payer: Self-pay | Admitting: Physician Assistant

## 2018-09-04 ENCOUNTER — Ambulatory Visit (INDEPENDENT_AMBULATORY_CARE_PROVIDER_SITE_OTHER): Payer: BLUE CROSS/BLUE SHIELD | Admitting: Nurse Practitioner

## 2018-09-04 ENCOUNTER — Encounter: Payer: Self-pay | Admitting: Nurse Practitioner

## 2018-09-04 VITALS — BP 125/69 | HR 79 | Temp 97.0°F | Ht 74.0 in | Wt 231.0 lb

## 2018-09-04 DIAGNOSIS — A084 Viral intestinal infection, unspecified: Secondary | ICD-10-CM | POA: Diagnosis not present

## 2018-09-04 MED ORDER — ONDANSETRON HCL 4 MG PO TABS: 4.0000 mg | ORAL_TABLET | Freq: Three times a day (TID) | ORAL | 0 refills | Status: DC | PRN

## 2018-09-04 NOTE — Progress Notes (Signed)
   Subjective:    Patient ID: Craig Howe, male    DOB: 1990-08-11, 28 y.o.   MRN: [id number]   Chief Complaint: Emesis (Since Sunday)   HPI Patient comes in today c/o nausea and vomiting.  Started 2 days ago. The last time he threw up was at 430 this morning. No diarrhea. He is able to keep liquids down as long as he sips on fluids. Not able to keep food down.  Review of Systems  Respiratory: Negative.   Cardiovascular: Negative.   Gastrointestinal: Positive for nausea and vomiting.  Neurological: Negative.   Psychiatric/Behavioral: Negative.   All other systems reviewed and are negative.      Objective:   Physical Exam Vitals signs and nursing note reviewed.  Constitutional:      Appearance: He is normal weight.  Cardiovascular:     Rate and Rhythm: Normal rate and regular rhythm.     Heart sounds: Normal heart sounds.  Pulmonary:     Effort: Pulmonary effort is normal.     Breath sounds: Normal breath sounds.  Abdominal:     General: Abdomen is flat. Bowel sounds are normal. There is no distension.     Palpations: Abdomen is soft. There is no mass.     Tenderness: There is no abdominal tenderness.  Skin:    General: Skin is warm.  Neurological:     General: No focal deficit present.     Mental Status: He is alert and oriented to person, place, and time.  Psychiatric:        Mood and Affect: Mood normal.        Behavior: Behavior normal.   BP 125/69   Pulse 79   Temp (!) 97 F (36.1 C) (Oral)   Ht 6\' 2"  (1.88 m)   Wt 231 lb (104.8 kg)   BMI 29.66 kg/m         Assessment & Plan:  Craig Howe in today with chief complaint of Emesis (Since Sunday)   1. Viral gastroenteritis First 24 Hours-Clear liquids  popsicles  Jello  gatorade  Sprite Second 24 hours-Add Full liquids ( Liquids you cant see through) Third 24 hours- Bland diet ( foods that are baked or broiled)  *avoiding fried foods and highly spiced foods* During these 3  days  Avoid milk, cheese, ice cream or any other dairy products  Avoid caffeine- REMEMBER Mt. Dew and Mello Yellow contain lots of caffeine You should eat and drink in  Frequent small volumes If no improvement in symptoms or worsen in 2-3 days should RETRUN TO OFFICE or go to ER!    - ondansetron (ZOFRAN) 4 MG tablet; Take 1 tablet (4 mg total) by mouth every 8 (eight) hours as needed for nausea or vomiting.  Dispense: 20 tablet; Refill: 0  Mary-Margaret Daphine Deutscher, FNP

## 2018-09-04 NOTE — Patient Instructions (Signed)

## 2018-09-12 ENCOUNTER — Emergency Department (HOSPITAL_COMMUNITY)
Admission: EM | Admit: 2018-09-12 | Discharge: 2018-09-13 | Disposition: A | Discharge status: Other Acute Inpatient Hospital | Payer: BLUE CROSS/BLUE SHIELD | Source: Home / Self Care | Attending: Emergency Medicine | Admitting: Emergency Medicine

## 2018-09-12 ENCOUNTER — Other Ambulatory Visit: Payer: Self-pay

## 2018-09-12 ENCOUNTER — Encounter (HOSPITAL_COMMUNITY): Payer: Self-pay | Admitting: Emergency Medicine

## 2018-09-12 DIAGNOSIS — F1110 Opioid abuse, uncomplicated: Secondary | ICD-10-CM

## 2018-09-12 DIAGNOSIS — F419 Anxiety disorder, unspecified: Secondary | ICD-10-CM | POA: Insufficient documentation

## 2018-09-12 DIAGNOSIS — R4589 Other symptoms and signs involving emotional state: Secondary | ICD-10-CM

## 2018-09-12 DIAGNOSIS — F329 Major depressive disorder, single episode, unspecified: Principal | ICD-10-CM

## 2018-09-12 DIAGNOSIS — F1123 Opioid dependence with withdrawal: Secondary | ICD-10-CM | POA: Diagnosis not present

## 2018-09-12 DIAGNOSIS — F909 Attention-deficit hyperactivity disorder, unspecified type: Secondary | ICD-10-CM

## 2018-09-12 DIAGNOSIS — Z79899 Other long term (current) drug therapy: Secondary | ICD-10-CM | POA: Insufficient documentation

## 2018-09-12 DIAGNOSIS — F17200 Nicotine dependence, unspecified, uncomplicated: Secondary | ICD-10-CM | POA: Insufficient documentation

## 2018-09-12 DIAGNOSIS — F1120 Opioid dependence, uncomplicated: Secondary | ICD-10-CM | POA: Diagnosis not present

## 2018-09-12 DIAGNOSIS — R45851 Suicidal ideations: Secondary | ICD-10-CM | POA: Insufficient documentation

## 2018-09-12 DIAGNOSIS — F1210 Cannabis abuse, uncomplicated: Secondary | ICD-10-CM

## 2018-09-12 HISTORY — DX: Major depressive disorder, single episode, unspecified: F32.9

## 2018-09-12 HISTORY — DX: Gastro-esophageal reflux disease without esophagitis: K21.9

## 2018-09-12 HISTORY — DX: Other psychoactive substance abuse, uncomplicated: F19.10

## 2018-09-12 HISTORY — DX: Attention-deficit hyperactivity disorder, unspecified type: F90.9

## 2018-09-12 LAB — SALICYLATE LEVEL: Salicylate Lvl: <7 mg/dL (ref 2.8–30.0)

## 2018-09-12 LAB — COMPREHENSIVE METABOLIC PANEL
ALBUMIN: 4.7 g/dL (ref 3.5–5.0)
ALT: 17 U/L (ref 0–44)
AST: 24 U/L (ref 15–41)
Alkaline Phosphatase: 40 U/L (ref 38–126)
Anion gap: 8 (ref 5–15)
BUN: 9 mg/dL (ref 6–20)
CO2: 26 mmol/L (ref 22–32)
Calcium: 9.3 mg/dL (ref 8.9–10.3)
Chloride: 104 mmol/L (ref 98–111)
Creatinine, Ser: 1.11 mg/dL (ref 0.61–1.24)
GFR calc Af Amer: >60 mL/min (ref >60)
GFR calc non Af Amer: >60 mL/min (ref >60)
Glucose, Bld: 98 mg/dL (ref 70–99)
POTASSIUM: 3.8 mmol/L (ref 3.5–5.1)
SODIUM: 138 mmol/L (ref 135–145)
Total Bilirubin: 1.1 mg/dL (ref 0.3–1.2)
Total Protein: 7.8 g/dL (ref 6.5–8.1)

## 2018-09-12 LAB — CBC
HCT: 48.3 % (ref 39.0–52.0)
Hemoglobin: 16.1 g/dL (ref 13.0–17.0)
MCH: 30.2 pg (ref 26.0–34.0)
MCHC: 33.3 g/dL (ref 30.0–36.0)
MCV: 90.6 fL (ref 80.0–100.0)
Platelets: 227 10*3/uL (ref 150–400)
RBC: 5.33 MIL/uL (ref 4.22–5.81)
RDW: 11.7 % (ref 11.5–15.5)
WBC: 6.3 10*3/uL (ref 4.0–10.5)
nRBC: 0 % (ref 0.0–0.2)

## 2018-09-12 LAB — ACETAMINOPHEN LEVEL: Acetaminophen (Tylenol), Serum: <10 ug/mL — ABNORMAL LOW (ref 10–30)

## 2018-09-12 LAB — ETHANOL: Alcohol, Ethyl (B): <10 mg/dL (ref <10)

## 2018-09-12 MED ORDER — METHOCARBAMOL 500 MG PO TABS: 500.0000 mg | ORAL_TABLET | Freq: Three times a day (TID) | ORAL | Status: DC | PRN

## 2018-09-12 MED ORDER — HYDROXYZINE HCL 25 MG PO TABS: 25.0000 mg | ORAL_TABLET | Freq: Four times a day (QID) | ORAL | Status: DC | PRN

## 2018-09-12 MED ORDER — DICYCLOMINE HCL 20 MG PO TABS: 20.0000 mg | ORAL_TABLET | Freq: Four times a day (QID) | ORAL | Status: DC | PRN

## 2018-09-12 MED ORDER — ONDANSETRON 4 MG PO TBDP: 4.0000 mg | ORAL_TABLET | Freq: Four times a day (QID) | ORAL | Status: DC | PRN

## 2018-09-12 MED ORDER — CLONIDINE HCL 0.1 MG PO TABS
0.1000 mg | ORAL_TABLET | Freq: Four times a day (QID) | ORAL | Status: DC
  Administered 2018-09-13 (×2): 0.1 mg via ORAL
  Filled 2018-09-12 (×2): qty 1

## 2018-09-12 MED ORDER — PANTOPRAZOLE SODIUM 40 MG PO TBEC
40.0000 mg | DELAYED_RELEASE_TABLET | Freq: Once | ORAL | Status: AC
  Administered 2018-09-13: 40 mg via ORAL
  Filled 2018-09-12: qty 1

## 2018-09-12 MED ORDER — CLONIDINE HCL 0.1 MG PO TABS: 0.1000 mg | ORAL_TABLET | Freq: Two times a day (BID) | ORAL | Status: DC

## 2018-09-12 MED ORDER — NAPROXEN 500 MG PO TABS: 500.0000 mg | ORAL_TABLET | Freq: Two times a day (BID) | ORAL | Status: DC | PRN

## 2018-09-12 MED ORDER — LOPERAMIDE HCL 2 MG PO CAPS: 2.0000 mg | ORAL_CAPSULE | ORAL | Status: DC | PRN

## 2018-09-12 MED ORDER — CLONIDINE HCL 0.1 MG PO TABS: 0.1000 mg | ORAL_TABLET | Freq: Every day | ORAL | Status: DC

## 2018-09-12 NOTE — ED Triage Notes (Signed)
Patient was banging his head on the concrete trying to kill hisself. Patient is also high.

## 2018-09-12 NOTE — ED Notes (Signed)
Bed: WTR5 Expected date:  Expected time:  Means of arrival:  Comments: 

## 2018-09-12 NOTE — ED Provider Notes (Signed)
[facility]-EMERGENCY DEPT Provider Note: Lowella Dell, MD, FACEP  CSN: [id number] MRN: [id number] ARRIVAL: 09/12/18 at 2151 ROOM: PFX90/WIO97   CHIEF COMPLAINT  Suicidal   HISTORY OF PRESENT ILLNESS  09/12/18 11:22 PM Craig Howe is a 28 y.o. male with a history of heroin abuse.  He states he began using again about 2 weeks ago and has been using heavily since.  He also has not been taking his medication (Abilify, Strattera, Depakote, GERD medication) and has developed epigastric discomfort.  He states he has had a poor appetite and has not eaten in several days with associated loss of weight.  He reportedly was banging his head on the concrete in an attempt to kill himself but the patient denies this.  He states he passed out and hit his head.  He denies significant pain as a result of the fall.  He states he is depressed, wants to die, and is very anxious.    Past Medical History:  Diagnosis Date  . ADHD   . GERD (gastroesophageal reflux disease)   . Major depressive disorder   . Substance abuse [facility])     Past Surgical History:  Procedure Laterality Date  . HERNIA REPAIR    . KNEE SURGERY      History reviewed. No pertinent family history.  Social History   Tobacco Use  . Smoking status: Current Some Day Smoker  . Smokeless tobacco: Current User    Types: Chew  Substance Use Topics  . Alcohol use: No    Frequency: Never  . Drug use: No    Prior to Admission medications   Medication Sig Start Date End Date Taking? Authorizing Provider  ARIPiprazole (ABILIFY) 15 MG tablet Take 15 mg by mouth daily. 06/18/18  Yes [provider]  atomoxetine (STRATTERA) 40 MG capsule Take 40 mg by mouth daily. 06/10/18  Yes [provider]  divalproex (DEPAKOTE) 250 MG DR tablet Take 250 mg by mouth 3 (three) times daily. 05/08/18  Yes [provider]  OLANZapine zydis (ZYPREXA) 5 MG disintegrating tablet Take 5 mg by mouth daily. 05/03/18  Yes [provider]  ondansetron (ZOFRAN) 4 MG tablet Take 1 tablet (4 mg total) by mouth every 8 (eight) hours as needed for nausea or vomiting. 09/04/18  Yes Daphine Deutscher, Mary-Margaret, FNP  traZODone (DESYREL) 50 MG tablet Take 50 mg by mouth at bedtime. 05/07/18  Yes [provider]    Allergies Patient has no known allergies.   REVIEW OF SYSTEMS  Negative except as noted here or in the History of Present Illness.   PHYSICAL EXAMINATION  Initial Vital Signs Blood pressure 127/77, pulse (!) 103, temperature 98.6 F (37 C), temperature source Oral, resp. rate 16, height 6\' 2"  (1.88 m), weight 98 kg, SpO2 98 %.  Examination General: Well-developed, well-nourished male in no acute distress; appearance consistent with age of record HENT: normocephalic; superficial abrasions to forehead and right cheek Eyes: pupils equal, round and reactive to light; extraocular muscles intact Neck: supple; non-tender Heart: regular rate and rhythm Lungs: clear to auscultation bilaterally Chest: Right lower rib tenderness Abdomen: soft; nondistended; nontender; no masses or hepatosplenomegaly; bowel sounds present Extremities: No deformity; full range of motion; pulses normal Neurologic: Awake, alert and oriented; motor function intact in all extremities and symmetric; no facial droop Skin: Warm and dry Psychiatric: Depressed mood with congruent affect; suicidal ideation   RESULTS  Summary of this visit's results, reviewed by myself:   EKG Interpretation  Date/Time:    Ventricular Rate:    PR Interval:    QRS Duration:   QT Interval:    QTC Calculation:   R Axis:     Text Interpretation:        Laboratory Studies: Results for orders placed or performed during the hospital encounter of 09/12/18 (from the past 24 hour(s))  Comprehensive metabolic panel     Status: None   Collection Time: 09/12/18 10:48 PM  Result Value Ref Range   Sodium 138 135 - 145 mmol/L   Potassium 3.8 3.5 - 5.1  mmol/L   Chloride 104 98 - 111 mmol/L   CO2 26 22 - 32 mmol/L   Glucose, Bld 98 70 - 99 mg/dL   BUN 9 6 - 20 mg/dL   Creatinine, Ser 1.611.11 0.61 - 1.24 mg/dL   Calcium 9.3 8.9 - 09.610.3 mg/dL   Total Protein 7.8 6.5 - 8.1 g/dL   Albumin 4.7 3.5 - 5.0 g/dL   AST 24 15 - 41 U/L   ALT 17 0 - 44 U/L   Alkaline Phosphatase 40 38 - 126 U/L   Total Bilirubin 1.1 0.3 - 1.2 mg/dL   GFR calc non Af Amer >60 >60 mL/min   GFR calc Af Amer >60 >60 mL/min   Anion gap 8 5 - 15  Ethanol     Status: None   Collection Time: 09/12/18 10:48 PM  Result Value Ref Range   Alcohol, Ethyl (B) <10 <10 mg/dL  Salicylate level     Status: None   Collection Time: 09/12/18 10:48 PM  Result Value Ref Range   Salicylate Lvl <7.0 2.8 - 30.0 mg/dL  Acetaminophen level     Status: Abnormal   Collection Time: 09/12/18 10:48 PM  Result Value Ref Range   Acetaminophen (Tylenol), Serum <10 (L) 10 - 30 ug/mL  cbc     Status: None   Collection Time: 09/12/18 10:48 PM  Result Value Ref Range   WBC 6.3 4.0 - 10.5 K/uL   RBC 5.33 4.22 - 5.81 MIL/uL   Hemoglobin 16.1 13.0 - 17.0 g/dL   HCT 04.548.3 40.939.0 - 81.152.0 %   MCV 90.6 80.0 - 100.0 fL   MCH 30.2 26.0 - 34.0 pg   MCHC 33.3 30.0 - 36.0 g/dL   RDW 91.411.7 78.211.5 - 95.615.5 %   Platelets 227 150 - 400 K/uL   nRBC 0.0 0.0 - 0.2 %  Rapid urine drug screen (hospital performed)     Status: Abnormal   Collection Time: 09/12/18 10:49 PM  Result Value Ref Range   Opiates POSITIVE (A) NONE DETECTED   Cocaine NONE DETECTED NONE DETECTED   Benzodiazepines NONE DETECTED NONE DETECTED   Amphetamines NONE DETECTED NONE DETECTED   Tetrahydrocannabinol POSITIVE (A) NONE DETECTED   Barbiturates NONE DETECTED NONE DETECTED   Imaging Studies: No results found.  ED COURSE and MDM  Nursing notes and initial vitals signs, including pulse oximetry, reviewed.  Vitals:   09/12/18 2203 09/13/18 0018  BP: 127/77 117/62  Pulse: (!) 103 96  Resp: 16 16  Temp: 98.6 F (37 C)   TempSrc: Oral     SpO2: 98% 98%  Weight: 98 kg   Height: 6\' 2"  (1.88 m)     PROCEDURES    ED DIAGNOSES     ICD-10-CM   1. Suicidal ideation R45.851   2. Heroin abuse (HCC) F11.10   3. Depressed mood F32.9   4. Anxiety F41.9   5. Cannabis  abuse F12.10        Krisa Blattner, Jonny Ruiz, MD 09/13/18 [phone]

## 2018-09-12 NOTE — BH Assessment (Addendum)
Assessment Note  Craig Howe is an 28 y.o. male presenting with heroin abuse. Patient reportedly was banging his head on the concrete in an attempt to kill himself but the patient denies this stating he fell down. Patient denied suicide stating "I can't say that word but I feel like I have no purpose, I am really off right now", patient then stated "I am uncontrollable right now and I don't feel I can keep myself safe right now. Patient has begun using heroin again 2 weeks ago. Patient reported using "a quarter daily". Patient denied SI and HI, but stated he feels that way but is not going to say the word "suicidal". Per EDP, patient has not been taking his medication (Abilify, Strattera, Depakote, GERD medication) and has developed epigastric discomfort. Patient reported increased depressive symptoms, crying spells, anxiety, irritable, isolating self, feelings of worthlessness and guilt. 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 Patient reported problems going to sleep with 4-5 hours of sleep. Patient reported poor appetite, not eating in past 4-5 days and has lost 25lbs. Patient resides with father,  mother and 53 year old daughter. Patient cooperative with assessment.  UDS in process BAL negative  Diagnosis: Major depressive disorder, Anxiety disorder, Opioid Usage and  PTSD  Past Medical History: History reviewed. No pertinent past medical history.  Past Surgical History:  Procedure Laterality Date  . HERNIA REPAIR    . KNEE SURGERY      Family History: History reviewed. No pertinent family history.  Social History:  reports that he has been smoking. His smokeless tobacco use includes chew. He reports that he does not drink alcohol or use drugs.  Additional Social History:  Alcohol / Drug Use Pain Medications: see MAR Prescriptions: see MAR Over the Counter: see  MAR  CIWA: CIWA-Ar BP: 127/77 Pulse Rate: (!) 103 COWS:    Allergies: No Known Allergies  Home Medications: (Not in a hospital admission)   OB/GYN Status:  No LMP for male patient.  General Assessment Data [facility] of [facility]: [facility] TTS Assessment: In system Is this a Tele or Face-to-Face Assessment?: Face-to-Face Is this an Initial Assessment or a Re-assessment for this encounter?: Initial Assessment Patient Accompanied by:: N/A Language Other than English: No Living Arrangements: (family home) What gender do you identify as?: Male Marital status: Single Pregnancy Status: (n/a) Living Arrangements: (mother and father, 57 y/o daughter) Can pt return to current living arrangement?: Yes Admission Status: Voluntary Is patient capable of signing voluntary admission?: Yes Referral Source: Self/Family/Friend     Crisis Care Plan Living Arrangements: (mother and father, 66 y/o daughter) Armed forces operational officer Guardian: (self) Name of Psychiatrist: (none) Name of Therapist: (none)  Education Status Is patient currently in school?: No Is the patient employed, unemployed or receiving disability?: 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 : No(denied) Suicidal Intent: No Has patient had any suicidal intent within the past 6 months prior to admission? : No Is patient at risk for suicide?: No Suicidal Plan?: No-Not Currently/Within Last 6 Months Has patient had any suicidal plan within the past 6 months prior to admission? : No Access to Means: No What has been your use of drugs/alcohol within the last 12 months?: (heroin) Previous Attempts/Gestures: No How many times?: (  0) Other Self Harm Risks: (0) Triggers for Past Attempts: None known Intentional Self Injurious Behavior: Bruising(beating head on concrete) Comment - Self Injurious Behavior: (high, banging  head on concrete) Family Suicide History: No Recent stressful life event(s): (heroin addiction) Persecutory voices/beliefs?: No Depression: Yes Depression Symptoms: Insomnia, Tearfulness, Isolating, Fatigue, Guilt, Loss of interest in usual pleasures, Feeling worthless/self pity, Feeling angry/irritable Substance abuse history and/or treatment for substance abuse?: Yes  Risk to Others within the past 6 months Homicidal Ideation: No Does patient have any lifetime risk of violence toward others beyond the six months prior to admission? : No Thoughts of Harm to Others: No Current Homicidal Intent: No Current Homicidal Plan: No Access to Homicidal Means: No History of harm to others?: No Assessment of Violence: None Noted Violent Behavior Description: (none) Does patient have access to weapons?: No Criminal Charges Pending?: No Does patient have a court date: No Is patient on probation?: No  Psychosis Hallucinations: None noted Delusions: None noted  Mental Status Report Appearance/Hygiene: Unremarkable Eye Contact: Fair Motor Activity: Freedom of movement Speech: Logical/coherent Level of Consciousness: Alert Mood: Anxious Affect: Anxious Anxiety Level: Moderate Thought Processes: Relevant, Coherent Judgement: Impaired Orientation: Person, Time, Situation, Place Obsessive Compulsive Thoughts/Behaviors: None  Cognitive Functioning Concentration: Fair Memory: Recent Intact Is patient IDD: No Insight: Fair Impulse Control: Poor Appetite: Poor Have you had any weight changes? : Loss Amount of the weight change? (lbs): (25) Sleep: Decreased Total Hours of Sleep: (4-5) Vegetative Symptoms: None  ADLScreening [facility] Assessment Services) Patient's cognitive ability adequate to safely complete daily activities?: Yes Patient able to express need for assistance with ADLs?: Yes Independently performs ADLs?: Yes (appropriate for developmental age)  Prior Inpatient  Therapy Prior Inpatient Therapy: Yes Prior Therapy Dates: (02/2018) Prior Therapy Facilty/Provider(s): [facility] ?) Reason for Treatment: (heroin)  Prior Outpatient Therapy Prior Outpatient Therapy: No Does patient have an ACCT team?: No Does patient have Intensive In-House Services?  : No Does patient have [facility] services? : No Does patient have P4CC services?: No  ADL Screening (condition at time of admission) Patient's cognitive ability adequate to safely complete daily activities?: Yes Patient able to express need for assistance with ADLs?: Yes Independently performs ADLs?: Yes (appropriate for developmental age)  Merchant navy officer (For Healthcare) Does Patient Have a Medical Advance Directive?: No Would patient like information on creating a medical advance directive?: No - Patient declined    Disposition:  Disposition Initial Assessment Completed for this Encounter: Yes  Donell Sievert, PA, patient meets inpatient criteria. TTS to secure placement. Merryl, RN, informed of disposition.  On Site Evaluation by:   Reviewed with Physician:    Burnetta Sabin, [facility] 09/12/2018 11:11 PM

## 2018-09-13 ENCOUNTER — Other Ambulatory Visit: Payer: Self-pay

## 2018-09-13 ENCOUNTER — Inpatient Hospital Stay (HOSPITAL_COMMUNITY)
Admission: AD | Admit: 2018-09-13 | Discharge: 2018-09-16 | DRG: 897 | Disposition: A | Discharge status: Home / Self Care | Payer: BLUE CROSS/BLUE SHIELD | Source: Intra-hospital | Attending: Psychiatry | Admitting: Psychiatry

## 2018-09-13 ENCOUNTER — Ambulatory Visit (HOSPITAL_COMMUNITY)
Admit: 2018-09-13 | Discharge: 2018-09-13 | Disposition: A | Discharge status: Home / Self Care | Payer: BLUE CROSS/BLUE SHIELD | Attending: Psychiatry | Admitting: Psychiatry

## 2018-09-13 ENCOUNTER — Encounter (HOSPITAL_COMMUNITY): Payer: Self-pay | Admitting: *Deleted

## 2018-09-13 DIAGNOSIS — F332 Major depressive disorder, recurrent severe without psychotic features: Secondary | ICD-10-CM | POA: Diagnosis present

## 2018-09-13 DIAGNOSIS — F1123 Opioid dependence with withdrawal: Principal | ICD-10-CM | POA: Diagnosis present

## 2018-09-13 DIAGNOSIS — F1120 Opioid dependence, uncomplicated: Secondary | ICD-10-CM | POA: Diagnosis present

## 2018-09-13 DIAGNOSIS — Z87891 Personal history of nicotine dependence: Secondary | ICD-10-CM

## 2018-09-13 DIAGNOSIS — R1010 Upper abdominal pain, unspecified: Secondary | ICD-10-CM | POA: Diagnosis present

## 2018-09-13 DIAGNOSIS — T1490XA Injury, unspecified, initial encounter: Secondary | ICD-10-CM

## 2018-09-13 DIAGNOSIS — F909 Attention-deficit hyperactivity disorder, unspecified type: Secondary | ICD-10-CM | POA: Diagnosis present

## 2018-09-13 DIAGNOSIS — F4310 Post-traumatic stress disorder, unspecified: Secondary | ICD-10-CM | POA: Diagnosis present

## 2018-09-13 DIAGNOSIS — F1121 Opioid dependence, in remission: Secondary | ICD-10-CM | POA: Diagnosis present

## 2018-09-13 HISTORY — DX: Anxiety disorder, unspecified: F41.9

## 2018-09-13 LAB — RAPID URINE DRUG SCREEN, HOSP PERFORMED
Amphetamines: NOT DETECTED
Barbiturates: NOT DETECTED
Benzodiazepines: NOT DETECTED
Cocaine: NOT DETECTED
Opiates: POSITIVE — AB
Tetrahydrocannabinol: POSITIVE — AB

## 2018-09-13 MED ORDER — CLONIDINE HCL 0.1 MG PO TABS
0.1000 mg | ORAL_TABLET | Freq: Every day | ORAL | Status: DC
  Filled 2018-09-13: qty 1

## 2018-09-13 MED ORDER — NAPROXEN 500 MG PO TABS: 500.0000 mg | ORAL_TABLET | Freq: Two times a day (BID) | ORAL | Status: DC | PRN

## 2018-09-13 MED ORDER — ACETAMINOPHEN 325 MG PO TABS: 650.0000 mg | ORAL_TABLET | Freq: Four times a day (QID) | ORAL | Status: DC | PRN

## 2018-09-13 MED ORDER — ACETAMINOPHEN 325 MG PO TABS
650.0000 mg | ORAL_TABLET | Freq: Four times a day (QID) | ORAL | Status: DC | PRN
  Administered 2018-09-15: 650 mg via ORAL
  Filled 2018-09-13: qty 2

## 2018-09-13 MED ORDER — LOPERAMIDE HCL 2 MG PO CAPS
2.0000 mg | ORAL_CAPSULE | ORAL | Status: DC | PRN
  Administered 2018-09-13: 4 mg via ORAL
  Filled 2018-09-13: qty 2

## 2018-09-13 MED ORDER — CLONIDINE HCL 0.1 MG PO TABS: 0.1000 mg | ORAL_TABLET | Freq: Every day | ORAL | Status: DC

## 2018-09-13 MED ORDER — ONDANSETRON 4 MG PO TBDP: 4.0000 mg | ORAL_TABLET | Freq: Four times a day (QID) | ORAL | Status: DC | PRN

## 2018-09-13 MED ORDER — CLONIDINE HCL 0.1 MG PO TABS
0.1000 mg | ORAL_TABLET | ORAL | Status: DC
  Administered 2018-09-15 – 2018-09-16 (×2): 0.1 mg via ORAL
  Filled 2018-09-13 (×4): qty 1

## 2018-09-13 MED ORDER — HYDROXYZINE HCL 25 MG PO TABS
25.0000 mg | ORAL_TABLET | Freq: Four times a day (QID) | ORAL | Status: DC | PRN
  Administered 2018-09-13 – 2018-09-15 (×4): 25 mg via ORAL
  Filled 2018-09-13 (×5): qty 1

## 2018-09-13 MED ORDER — CLONIDINE HCL 0.1 MG PO TABS
0.1000 mg | ORAL_TABLET | Freq: Four times a day (QID) | ORAL | Status: AC
  Administered 2018-09-13 – 2018-09-15 (×7): 0.1 mg via ORAL
  Filled 2018-09-13 (×8): qty 1

## 2018-09-13 MED ORDER — ALUM & MAG HYDROXIDE-SIMETH 200-200-20 MG/5ML PO SUSP: 30.0000 mL | ORAL | Status: DC | PRN

## 2018-09-13 MED ORDER — NAPROXEN 500 MG PO TABS
500.0000 mg | ORAL_TABLET | Freq: Two times a day (BID) | ORAL | Status: DC | PRN
  Administered 2018-09-13: 500 mg via ORAL
  Filled 2018-09-13: qty 1

## 2018-09-13 MED ORDER — HYDROXYZINE HCL 25 MG PO TABS
25.0000 mg | ORAL_TABLET | Freq: Four times a day (QID) | ORAL | Status: DC | PRN
  Administered 2018-09-14: 25 mg via ORAL

## 2018-09-13 MED ORDER — DICYCLOMINE HCL 20 MG PO TABS: 20.0000 mg | ORAL_TABLET | Freq: Four times a day (QID) | ORAL | Status: DC | PRN

## 2018-09-13 MED ORDER — MAGNESIUM HYDROXIDE 400 MG/5ML PO SUSP: 30.0000 mL | Freq: Every day | ORAL | Status: DC | PRN

## 2018-09-13 MED ORDER — GABAPENTIN 300 MG PO CAPS
300.0000 mg | ORAL_CAPSULE | Freq: Three times a day (TID) | ORAL | Status: DC
  Administered 2018-09-13 – 2018-09-16 (×9): 300 mg via ORAL
  Filled 2018-09-13 (×16): qty 1

## 2018-09-13 MED ORDER — DICYCLOMINE HCL 20 MG PO TABS
20.0000 mg | ORAL_TABLET | Freq: Four times a day (QID) | ORAL | Status: DC | PRN
  Administered 2018-09-13 – 2018-09-14 (×4): 20 mg via ORAL
  Filled 2018-09-13 (×4): qty 1

## 2018-09-13 MED ORDER — LORAZEPAM 1 MG PO TABS
1.0000 mg | ORAL_TABLET | Freq: Once | ORAL | Status: AC
  Administered 2018-09-13: 1 mg via ORAL
  Filled 2018-09-13: qty 1

## 2018-09-13 MED ORDER — FAMOTIDINE 40 MG PO TABS
40.0000 mg | ORAL_TABLET | Freq: Two times a day (BID) | ORAL | Status: DC
  Administered 2018-09-13 – 2018-09-16 (×6): 40 mg via ORAL
  Filled 2018-09-13 (×10): qty 1

## 2018-09-13 MED ORDER — METHOCARBAMOL 750 MG PO TABS
750.0000 mg | ORAL_TABLET | Freq: Three times a day (TID) | ORAL | Status: DC
  Administered 2018-09-13 – 2018-09-16 (×9): 750 mg via ORAL
  Filled 2018-09-13 (×17): qty 1

## 2018-09-13 MED ORDER — NICOTINE POLACRILEX 2 MG MT GUM
2.0000 mg | CHEWING_GUM | OROMUCOSAL | Status: DC | PRN
  Administered 2018-09-13 – 2018-09-14 (×5): 2 mg via ORAL
  Filled 2018-09-13 (×4): qty 1

## 2018-09-13 MED ORDER — TRAZODONE HCL 100 MG PO TABS
100.0000 mg | ORAL_TABLET | Freq: Every evening | ORAL | Status: DC | PRN
  Administered 2018-09-13 – 2018-09-15 (×3): 100 mg via ORAL
  Filled 2018-09-13 (×11): qty 1

## 2018-09-13 MED ORDER — LOPERAMIDE HCL 2 MG PO CAPS: 2.0000 mg | ORAL_CAPSULE | ORAL | Status: DC | PRN

## 2018-09-13 MED ORDER — METHOCARBAMOL 500 MG PO TABS
500.0000 mg | ORAL_TABLET | Freq: Three times a day (TID) | ORAL | Status: DC | PRN
  Administered 2018-09-14: 500 mg via ORAL

## 2018-09-13 MED ORDER — CLONIDINE HCL 0.1 MG PO TABS: 0.1000 mg | ORAL_TABLET | ORAL | Status: DC

## 2018-09-13 MED ORDER — METHOCARBAMOL 500 MG PO TABS: 500.0000 mg | ORAL_TABLET | Freq: Three times a day (TID) | ORAL | Status: DC | PRN

## 2018-09-13 MED ORDER — NICOTINE 21 MG/24HR TD PT24
21.0000 mg | MEDICATED_PATCH | Freq: Every day | TRANSDERMAL | Status: DC
  Administered 2018-09-13: 21 mg via TRANSDERMAL
  Filled 2018-09-13 (×2): qty 1

## 2018-09-13 MED ORDER — TEMAZEPAM 15 MG PO CAPS
30.0000 mg | ORAL_CAPSULE | Freq: Every day | ORAL | Status: DC
  Administered 2018-09-13 – 2018-09-15 (×3): 30 mg via ORAL
  Filled 2018-09-13 (×3): qty 2

## 2018-09-13 MED ORDER — CLONIDINE HCL 0.1 MG PO TABS: 0.1000 mg | ORAL_TABLET | Freq: Four times a day (QID) | ORAL | Status: DC

## 2018-09-13 MED ORDER — TRAZODONE HCL 100 MG PO TABS: 200.0000 mg | ORAL_TABLET | Freq: Every evening | ORAL | Status: DC | PRN

## 2018-09-13 MED ORDER — PRIMIDONE 50 MG PO TABS
50.0000 mg | ORAL_TABLET | Freq: Two times a day (BID) | ORAL | Status: AC
  Administered 2018-09-13 – 2018-09-14 (×4): 50 mg via ORAL
  Filled 2018-09-13 (×4): qty 1
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
## 2018-09-13 NOTE — Progress Notes (Signed)
Patient verbalized in group that he was simply happy to be at [facility]. His goal for tomorrow is to get more involved with the programming.

## 2018-09-13 NOTE — ED Notes (Signed)
[facility] called for transport. 

## 2018-09-13 NOTE — ED Notes (Signed)
Pt belongings given to girlfriend

## 2018-09-13 NOTE — ED Notes (Signed)
Pt alert and oriented. Pt denies any pain or discomfort. Pt c/o of si without a plan. Pt denies hi and avh. Pt contract to safety. Pt cooperative, and resting in bed. Will continue to monitor.
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
## 2018-09-13 NOTE — ED Notes (Signed)
[facility], patient accepted to [facility] Adult Unit 302 Bed 2.  Dr. Jola Babinski is accepting.  Arrival time after 9am.  Nurse to nurse report [phone] Melina Schools, RN, informed of acceptance.

## 2018-09-13 NOTE — ED Notes (Addendum)
Donell Sievert, PA, patient meets inpatient criteria. TTS to secure placement. Merryl, RN, informed of disposition.

## 2018-09-13 NOTE — BHH Suicide Risk Assessment (Signed)
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 Bee reports a heroin dependency and relapse lasting 2 weeks involving at least 0.5 g a day up to 2 g a day reports related depressive symptoms anxiety and withdrawal Continued Clinical Symptoms:    The "Alcohol Use Disorders Identification Test", Guidelines for Use in Primary Care, Second Edition.  World Science writer [facility]). Score between 0-7:  no or low risk or alcohol related problems. Score between 8-15:  moderate risk of alcohol related problems. Score between 16-19:  high risk of alcohol related problems. Score 20 or above:  warrants further diagnostic evaluation for alcohol dependence and treatment.   CLINICAL FACTORS:   Depression:   Insomnia   COGNITIVE FEATURES THAT CONTRIBUTE TO RISK:  None    SUICIDE RISK:   Minimal: No identifiable suicidal ideation.  Patients presenting with no risk factors but with morbid ruminations; may be classified as minimal risk based on the severity of the depressive symptoms  PLAN OF CARE: Given detox address collateral issues  I certify that inpatient services furnished can reasonably be expected to improve the patient's condition.   Malvin Johns, MD 09/13/2018, 11:17 AM

## 2018-09-13 NOTE — Progress Notes (Signed)
Patient left with [facility] via [facility] to [facility] for test.

## 2018-09-13 NOTE — ED Notes (Signed)
Bed: [facility] Expected date:  Expected time:  Means of arrival:  Comments: Margo Aye C

## 2018-09-13 NOTE — Tx Team (Signed)
Initial Treatment Plan 09/13/2018 1:49 PM Craig Howe [id number]    PATIENT STRESSORS: Financial difficulties Medication change or noncompliance Substance abuse   PATIENT STRENGTHS: Ability for insight Average or above average intelligence Capable of independent living Communication skills General fund of knowledge Motivation for treatment/growth Physical Health Supportive family/friends Work skills   PATIENT IDENTIFIED PROBLEMS: "anxiety"  "depression"  "suicidal thoughts"  "substance abuse"               DISCHARGE CRITERIA:  Ability to meet basic life and health needs Improved stabilization in mood, thinking, and/or behavior Motivation to continue treatment in a less acute level of care Need for constant or close observation no longer present Reduction of life-threatening or endangering symptoms to within safe limits Safe-care adequate arrangements made Verbal commitment to aftercare and medication compliance Withdrawal symptoms are absent or subacute and managed without 24-hour nursing intervention  PRELIMINARY DISCHARGE PLAN: Attend aftercare/continuing care group Attend PHP/IOP Attend 12-step recovery group Outpatient therapy Placement in alternative living arrangements Return to previous work or school arrangements  PATIENT/FAMILY INVOLVEMENT: This treatment plan has been presented to and reviewed with the patient, Craig Howe.  The patient and family have been given the opportunity to ask questions and make suggestions.  Craig Reichert Viborg, RN 09/13/2018, 1:49 PM

## 2018-09-13 NOTE — BHH Group Notes (Signed)
BHH LCSW Group Therapy Note  Date/Time 09/13/2018 1:15 PM  Type of Therapy/Topic:  Group Therapy:  Feelings about Diagnosis  Participation Level:  Active   Mood: Depressed  Description of Group:    This group will allow patients to explore their thoughts and feelings about diagnoses they have received. Patients will be guided to explore their level of understanding and acceptance of these diagnoses. Facilitator will encourage patients to process their thoughts and feelings about the reactions of others to their diagnosis, and will guide patients in identifying ways to discuss their diagnosis with significant others in their lives. This group will be process-oriented, with patients participating in exploration of their own experiences as well as giving and receiving support and challenge from other group members.   Therapeutic Goals: 1. Patient will demonstrate understanding of diagnosis as evidence by identifying two or more symptoms of the disorder:  2. Patient will be able to express two feelings regarding the diagnosis 3. Patient will demonstrate ability to communicate their needs through discussion and/or role plays  Summary of Patient Progress: Craig Howe attended the entire session. He stated understands his diagnosis and  that he is "sick and tired of having relapses". Craig Howe also understands that this is a disease and that he has learned more about himself through his diagnosis.    Therapeutic Modalities:   Cognitive Behavioral Therapy Brief Therapy Feelings Identification   Marian Sorrow, MSW Intern 09/13/2018 1:15 PM

## 2018-09-13 NOTE — ED Notes (Signed)
[facility] transport on unit to transfer pt to [facility] Adult unit per MD order. Personal property given to [employer] for transport. Ambulatory off unit.

## 2018-09-13 NOTE — BH Assessment (Signed)
[facility] Assessment Progress Note  Per Juanetta Beets, DO, this pt requires psychiatric hospitalization at this time.  Berneice Heinrich, RN, [facility] has assigned pt to [facility] Rm 302-2; [facility] will be ready to receive pt at 09:00.  Pt has signed Voluntary Admission and Consent for Treatment, as well as Consent to Release Information to no one, and signed forms have been faxed to [facility].  Pt's nurse, Morrie Sheldon, has been notified, and agrees to send original paperwork along with pt via Juel Burrow, and to call report to [phone].  Doylene Canning, [place] Behavioral Health Coordinator [phone]

## 2018-09-13 NOTE — Progress Notes (Signed)
Patient returned from [facility].  Presently patient is resting in bed with eyes closed.  Respirations even and unlabored.  No signs/symptoms of pain/distress noted on patient's face/body movements. Safety maintained with 15 minute checks.

## 2018-09-13 NOTE — Progress Notes (Signed)
[facility] called and this patient will be transported by [facility] to [facility] for scan per MD order. [facility] will arrive about 30 minutes.  [facility] aware of transport time.

## 2018-09-13 NOTE — Plan of Care (Signed)
Nurse discussed anxiety, depression, coping skills and suicidal thoughts with patient.

## 2018-09-13 NOTE — H&P (Signed)
Psychiatric Admission Assessment Adult  Patient Identification: Craig Howe MRN:  [id number] Date of Evaluation:  09/13/2018 Chief Complaint:  MDD Principal Diagnosis: Opiate dependency depression/substance-induced mood disorder Diagnosis:  Active Problems:   Opioid dependence in remission (HCC)   Opiate dependence (HCC)  History of Present Illness:   This is the latest of multiple admissions/mental health encounters for Craig Howe, he reports no prior admissions here however.  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 States that now when he uses heroin he has about 10 minutes of a "buzz" but then he feels "like crap" Is ready to get clean -feels it is "not worth it" to continue dependent upon opiates. While impaired patient reportedly banged his head on the concrete and attempt to harm himself he denies that he was trying to harm himself states he "just fell down" while he was high  The patient again has had numerous treatments including rehab and recently in [facility] and was released 8 months ago. 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 He reports recent insomnia Reports upper abdominal pain sort of centrally located that he states is consistent with his past peptic ulcer disease  Somewhat med  seeking during interview wanting "a little Suboxone"..."something to sleep like Ativan" states he simply wants his anxiety treated and then he will be fine.  Current mental status exam-alert oriented to person place time day situation so forth, denies thoughts of harming self or others can contract fully denies current auditory visual loose [place] denies racing thoughts or other manic symptoms      Associated Signs/Symptoms: Depression Symptoms:  depressed mood, insomnia, (Hypo) Manic Symptoms:  Impulsivity, Anxiety Symptoms:  Social Anxiety, Psychotic Symptoms:  n/a PTSD Symptoms: Had a traumatic exposure:  Reports PTSD symptoms from past exposure in prison Total Time spent with patient: 45 minutes  Past Psychiatric History: Again has had numerous psychiatric diagnoses and polysubstance abuse in the past his past medications have been numerous his most recent medications include Abilify Strattera and Depakote as well as olanzapine  Is the patient at risk to self? Yes.    Has the patient been a risk to self in the past 6 months? No.  Has the patient been a risk to self within the distant past? No.  Is the patient a risk to others? No.  Has the patient been a risk to others in the past 6 months? No.  Has the patient been a risk to others within the distant past? No.   Alcohol Screening:   Substance Abuse History in the last 12 months:  Yes.   Consequences of Substance Abuse: NA Previous Psychotropic Medications: Yes  Psychological Evaluations: No  Past Medical History:  Past Medical History:  Diagnosis Date  . ADHD   . GERD (gastroesophageal reflux disease)   .  Major depressive disorder   . Substance abuse [facility])     Past Surgical History:  Procedure Laterality Date  . HERNIA REPAIR    . KNEE SURGERY     Family History: No family history on file. Family Psychiatric  History: ukn Tobacco Screening:   Social History:  Social History   Substance and Sexual Activity   Alcohol Use No  . Frequency: Never     Social History   Substance and Sexual Activity  Drug Use No    Additional Social History:                           Allergies:  No Known Allergies Lab Results:  Results for orders placed or performed during the hospital encounter of 09/12/18 (from the past 48 hour(s))  Comprehensive metabolic panel     Status: None   Collection Time: 09/12/18 10:48 PM  Result Value Ref Range   Sodium 138 135 - 145 mmol/L   Potassium 3.8 3.5 - 5.1 mmol/L   Chloride 104 98 - 111 mmol/L   CO2 26 22 - 32 mmol/L   Glucose, Bld 98 70 - 99 mg/dL   BUN 9 6 - 20 mg/dL   Creatinine, Ser 4.09 0.61 - 1.24 mg/dL   Calcium 9.3 8.9 - 81.1 mg/dL   Total Protein 7.8 6.5 - 8.1 g/dL   Albumin 4.7 3.5 - 5.0 g/dL   AST 24 15 - 41 U/L   ALT 17 0 - 44 U/L   Alkaline Phosphatase 40 38 - 126 U/L   Total Bilirubin 1.1 0.3 - 1.2 mg/dL   GFR calc non Af Amer >60 >60 mL/min   GFR calc Af Amer >60 >60 mL/min   Anion gap 8 5 - 15    Comment: Performed at [facility], [address] [place], [place] [address]  Ethanol     Status: None   Collection Time: 09/12/18 10:48 PM  Result Value Ref Range   Alcohol, Ethyl (B) <10 <10 mg/dL    Comment: (NOTE) Lowest detectable limit for serum alcohol is 10 mg/dL. For medical purposes only. Performed at [facility], [address], [place], [place] [address]   Salicylate level     Status: None   Collection Time: 09/12/18 10:48 PM  Result Value Ref Range   Salicylate Lvl <7.0 2.8 - 30.0 mg/dL    Comment: Performed at [facility], [address] [place], [place] [address]  Acetaminophen level     Status: Abnormal   Collection Time: 09/12/18 10:48 PM  Result Value Ref Range   Acetaminophen (Tylenol), Serum <10 (L) 10 - 30 ug/mL    Comment: (NOTE) Therapeutic concentrations vary significantly. A range of 10-30 ug/mL  may be an effective concentration for many  patients. However, some  are best treated at concentrations outside of this range. Acetaminophen concentrations >150 ug/mL at 4 hours after ingestion  and >50 ug/mL at 12 hours after ingestion are often associated with  toxic reactions. Performed at [facility], [address] [place], [place] [address]   cbc     Status: None   Collection Time: 09/12/18 10:48 PM  Result Value Ref Range   WBC 6.3 4.0 - 10.5 K/uL   RBC 5.33 4.22 - 5.81 MIL/uL   Hemoglobin 16.1 13.0 - 17.0 g/dL   HCT 96.2 95.2 - 84.1 %   MCV 90.6 80.0 - 100.0 fL  MCH 30.2 26.0 - 34.0 pg   MCHC 33.3 30.0 - 36.0 g/dL   RDW 53.6 46.8 - 03.2 %   Platelets 227 150 - 400 K/uL   nRBC 0.0 0.0 - 0.2 %    Comment: Performed at [facility], [address], [place], [place] [address]  Rapid urine drug screen (hospital performed)     Status: Abnormal   Collection Time: 09/12/18 10:49 PM  Result Value Ref Range   Opiates POSITIVE (A) NONE DETECTED   Cocaine NONE DETECTED NONE DETECTED   Benzodiazepines NONE DETECTED NONE DETECTED   Amphetamines NONE DETECTED NONE DETECTED   Tetrahydrocannabinol POSITIVE (A) NONE DETECTED   Barbiturates NONE DETECTED NONE DETECTED    Comment: (NOTE) DRUG SCREEN FOR MEDICAL PURPOSES ONLY.  IF CONFIRMATION IS NEEDED FOR ANY PURPOSE, NOTIFY LAB WITHIN 5 DAYS. LOWEST DETECTABLE LIMITS FOR URINE DRUG SCREEN Drug Class                     Cutoff (ng/mL) Amphetamine and metabolites    1000 Barbiturate and metabolites    200 Benzodiazepine                 200 Tricyclics and metabolites     300 Opiates and metabolites        300 Cocaine and metabolites        300 THC                            50 Performed at [facility], [address], [place], [place] [address]     Blood Alcohol level:  Lab Results  Component Value Date   ETH <10 09/12/2018   ETH <10 04/05/2018    Metabolic Disorder Labs:  No results found for: HGBA1C,  MPG No results found for: PROLACTIN No results found for: CHOL, TRIG, HDL, CHOLHDL, VLDL, LDLCALC  Current Medications: Current Facility-Administered Medications  Medication Dose Route Frequency Provider Last Rate Last Dose  . acetaminophen (TYLENOL) tablet 650 mg  650 mg Oral Q6H PRN Malvin Johns, MD      . alum & mag hydroxide-simeth (MAALOX/MYLANTA) 200-200-20 MG/5ML suspension 30 mL  30 mL Oral Q4H PRN Malvin Johns, MD      . dicyclomine (BENTYL) tablet 20 mg  20 mg Oral Q6H PRN Malvin Johns, MD      . famotidine (PEPCID) tablet 40 mg  40 mg Oral BID Malvin Johns, MD      . gabapentin (NEURONTIN) capsule 300 mg  300 mg Oral TID Malvin Johns, MD      . hydrOXYzine (ATARAX/VISTARIL) tablet 25 mg  25 mg Oral Q6H PRN Malvin Johns, MD      . loperamide (IMODIUM) capsule 2-4 mg  2-4 mg Oral PRN Malvin Johns, MD      . magnesium hydroxide (MILK OF MAGNESIA) suspension 30 mL  30 mL Oral Daily PRN Malvin Johns, MD      . methocarbamol (ROBAXIN) tablet 750 mg  750 mg Oral TID Malvin Johns, MD      . naproxen (NAPROSYN) tablet 500 mg  500 mg Oral BID PRN Malvin Johns, MD      . ondansetron (ZOFRAN-ODT) disintegrating tablet 4 mg  4 mg Oral Q6H PRN Malvin Johns, MD      . primidone (MYSOLINE) tablet 50 mg  50 mg Oral BID Malvin Johns, MD      . temazepam (RESTORIL) capsule 30  mg  30 mg Oral QHS Malvin Johns, MD      . traZODone (DESYREL) tablet 200 mg  200 mg Oral QHS PRN Malvin Johns, MD       PTA Medications: Medications Prior to Admission  Medication Sig Dispense Refill Last Dose  . ARIPiprazole (ABILIFY) 15 MG tablet Take 15 mg by mouth daily.   Past Month at Unknown time  . atomoxetine (STRATTERA) 40 MG capsule Take 40 mg by mouth daily.   Past Month at Unknown time  . divalproex (DEPAKOTE) 250 MG DR tablet Take 250 mg by mouth 3 (three) times daily.   Past Month at Unknown time  . OLANZapine zydis (ZYPREXA) 5 MG disintegrating tablet Take 5 mg by mouth daily.   Past Month at Unknown time   . ondansetron (ZOFRAN) 4 MG tablet Take 1 tablet (4 mg total) by mouth every 8 (eight) hours as needed for nausea or vomiting. 20 tablet 0 Past Month at Unknown time  . traZODone (DESYREL) 50 MG tablet Take 50 mg by mouth at bedtime.   Past Month at Unknown time    Musculoskeletal: Strength & Muscle Tone: within normal limits Gait & Station: normal Patient leans: N/A  Psychiatric Specialty Exam: Physical Exam  ROS  There were no vitals taken for this visit.There is no height or weight on file to calculate BMI.  General Appearance: Casual  Eye Contact:  Good  Speech:  Clear and Coherent  Volume:  Normal  Mood:  Anxious  Affect:  Congruent  Thought Process:  Descriptions of Associations: Intact  Orientation:  Full (Time, Place, and Person)  Thought Content:  Logical  Suicidal Thoughts:  No  Homicidal Thoughts:  No  Memory:  Immediate;   Good  Judgement:  good  Insight:  Good  Psychomotor Activity:  Normal  Concentration:  Concentration: Good  Recall:  Good  Fund of Knowledge:  Good  Language:  Good  Akathisia:  Negative  Handed:  Right  AIMS (if indicated):     Assets:  Social Support  ADL's:  Intact  Cognition:  WNL  Sleep:       Treatment Plan Summary: Daily contact with patient to assess and evaluate symptoms and progress in treatment and Medication management  Observation Level/Precautions:  15 minute checks  Laboratory:  UDS  Psychotherapy:    Medications:    Consultations:    Discharge Concerns:    Estimated LOS:  Other:    Axis I-substance-induced mood disorder bipolar type/cannabis abuse versus dependence/opiate dependence/recent relapse Axis II deferred Axis III reporting withdrawal symptoms/peptic ulcer disease    Physician Treatment Plan for Primary Diagnosis: <principal problem not specified> Long Term Goal(s): Improvement in symptoms so as ready for discharge  Short Term Goals: Ability to identify changes in lifestyle to reduce recurrence of  condition will improve and Ability to disclose and discuss suicidal ideas  Physician Treatment Plan for Secondary Diagnosis: Active Problems:   Opioid dependence in remission (HCC)   Opiate dependence (HCC)  Long Term Goal(s): Improvement in symptoms so as ready for discharge  Short Term Goals: Compliance with prescribed medications will improve  I certify that inpatient services furnished can reasonably be expected to improve the patient's condition.    Malvin Johns, MD 2/27/202011:18 AM

## 2018-09-13 NOTE — Progress Notes (Signed)
D: Pt  passive AH- non command. denies SI/HI/VH. Pt stated he was anxious and having bad withdrawals. Pt observed on the unit to be in no distress. Pt focused on his medications and what he can have next.  A: Pt was offered support and encouragement. Pt was given scheduled medications. Pt was encourage to attend groups. Q 15 minute checks were done for safety.  R: safety maintained on unit.  Problem: Activity: Goal: Interest or engagement in activities will improve Outcome: Progressing

## 2018-09-14 NOTE — Tx Team (Signed)
Interdisciplinary Treatment and Diagnostic Plan Update  09/14/2018 Time of Session: 9:00am Craig Howe MRN: [id number]  Principal Diagnosis: <principal problem not specified>  Secondary Diagnoses: Active Problems:   Opioid dependence in remission (HCC)   Opiate dependence (HCC)   MDD (major depressive disorder), recurrent episode, severe (HCC)   Current Medications:  Current Facility-Administered Medications  Medication Dose Route Frequency Provider Last Rate Last Dose  . acetaminophen (TYLENOL) tablet 650 mg  650 mg Oral Q6H PRN Kerry Hough, PA-C      . alum & mag hydroxide-simeth (MAALOX/MYLANTA) 200-200-20 MG/5ML suspension 30 mL  30 mL Oral Q4H PRN Kerry Hough, PA-C      . cloNIDine (CATAPRES) tablet 0.1 mg  0.1 mg Oral QID Donell Sievert E, PA-C   0.1 mg at 09/13/18 1636   Followed by  . [START ON 09/15/2018] cloNIDine (CATAPRES) tablet 0.1 mg  0.1 mg Oral BH-qamhs Simon, Spencer E, PA-C       Followed by  . [START ON 09/18/2018] cloNIDine (CATAPRES) tablet 0.1 mg  0.1 mg Oral QAC breakfast Donell Sievert E, PA-C      . dicyclomine (BENTYL) tablet 20 mg  20 mg Oral Q6H PRN Donell Sievert E, PA-C   20 mg at 09/13/18 1827  . famotidine (PEPCID) tablet 40 mg  40 mg Oral BID Malvin Johns, MD   40 mg at 09/13/18 1627  . gabapentin (NEURONTIN) capsule 300 mg  300 mg Oral TID Malvin Johns, MD   300 mg at 09/13/18 1627  . hydrOXYzine (ATARAX/VISTARIL) tablet 25 mg  25 mg Oral Q6H PRN Kerry Hough, PA-C      . hydrOXYzine (ATARAX/VISTARIL) tablet 25 mg  25 mg Oral Q6H PRN Malvin Johns, MD   25 mg at 09/13/18 2152  . loperamide (IMODIUM) capsule 2-4 mg  2-4 mg Oral PRN Donell Sievert E, PA-C   4 mg at 09/13/18 1425  . magnesium hydroxide (MILK OF MAGNESIA) suspension 30 mL  30 mL Oral Daily PRN Kerry Hough, PA-C      . methocarbamol (ROBAXIN) tablet 500 mg  500 mg Oral Q8H PRN Kerry Hough, PA-C      . methocarbamol (ROBAXIN) tablet 750 mg  750 mg Oral TID Malvin Johns, MD   750 mg at 09/13/18 1628  . naproxen (NAPROSYN) tablet 500 mg  500 mg Oral BID PRN Kerry Hough, PA-C   500 mg at 09/13/18 1827  . nicotine polacrilex (NICORETTE) gum 2 mg  2 mg Oral PRN Malvin Johns, MD   2 mg at 09/13/18 2154  . ondansetron (ZOFRAN-ODT) disintegrating tablet 4 mg  4 mg Oral Q6H PRN Kerry Hough, PA-C      . primidone (MYSOLINE) tablet 50 mg  50 mg Oral BID Malvin Johns, MD   50 mg at 09/13/18 2151  . temazepam (RESTORIL) capsule 30 mg  30 mg Oral QHS Malvin Johns, MD   30 mg at 09/13/18 2152  . traZODone (DESYREL) tablet 100 mg  100 mg Oral QHS,MR X 1 Kerry Hough, PA-C   100 mg at 09/13/18 2152  . traZODone (DESYREL) tablet 200 mg  200 mg Oral QHS PRN Malvin Johns, MD       PTA Medications: Medications Prior to Admission  Medication Sig Dispense Refill Last Dose  . ARIPiprazole (ABILIFY) 15 MG tablet Take 15 mg by mouth daily.   Past Month at Unknown time  . atomoxetine (STRATTERA) 40 MG capsule Take 40  mg by mouth daily.   Past Month at Unknown time  . divalproex (DEPAKOTE) 250 MG DR tablet Take 250 mg by mouth 3 (three) times daily.   Past Month at Unknown time  . OLANZapine zydis (ZYPREXA) 5 MG disintegrating tablet Take 5 mg by mouth daily.   Past Month at Unknown time  . ondansetron (ZOFRAN) 4 MG tablet Take 1 tablet (4 mg total) by mouth every 8 (eight) hours as needed for nausea or vomiting. 20 tablet 0 Past Month at Unknown time  . traZODone (DESYREL) 50 MG tablet Take 50 mg by mouth at bedtime.   Past Month at Unknown time    Patient Stressors: Financial difficulties Medication change or noncompliance Substance abuse  Patient Strengths: Ability for insight Average or above average intelligence Capable of independent living Communication skills General fund of knowledge Motivation for treatment/growth Physical Health Supportive family/friends Work skills  Treatment Modalities: Medication Management, Group therapy, Case management,   1 to 1 session with clinician, Psychoeducation, Recreational therapy.   Physician Treatment Plan for Primary Diagnosis: <principal problem not specified> Long Term Goal(s): Improvement in symptoms so as ready for discharge Improvement in symptoms so as ready for discharge   Short Term Goals: Ability to identify changes in lifestyle to reduce recurrence of condition will improve Ability to disclose and discuss suicidal ideas Compliance with prescribed medications will improve  Medication Management: Evaluate patient's response, side effects, and tolerance of medication regimen.  Therapeutic Interventions: 1 to 1 sessions, Unit Group sessions and Medication administration.  Evaluation of Outcomes: Progressing  Physician Treatment Plan for Secondary Diagnosis: Active Problems:   Opioid dependence in remission (HCC)   Opiate dependence (HCC)   MDD (major depressive disorder), recurrent episode, severe (HCC)  Long Term Goal(s): Improvement in symptoms so as ready for discharge Improvement in symptoms so as ready for discharge   Short Term Goals: Ability to identify changes in lifestyle to reduce recurrence of condition will improve Ability to disclose and discuss suicidal ideas Compliance with prescribed medications will improve     Medication Management: Evaluate patient's response, side effects, and tolerance of medication regimen.  Therapeutic Interventions: 1 to 1 sessions, Unit Group sessions and Medication administration.  Evaluation of Outcomes: Progressing   RN Treatment Plan for Primary Diagnosis: <principal problem not specified> Long Term Goal(s): Knowledge of disease and therapeutic regimen to maintain health will improve  Short Term Goals: Ability to demonstrate self-control, Ability to participate in decision making will improve, Ability to verbalize feelings will improve and Ability to identify and develop effective coping behaviors will improve  Medication  Management: RN will administer medications as ordered by provider, will assess and evaluate patient's response and provide education to patient for prescribed medication. RN will report any adverse and/or side effects to prescribing provider.  Therapeutic Interventions: 1 on 1 counseling sessions, Psychoeducation, Medication administration, Evaluate responses to treatment, Monitor vital signs and CBGs as ordered, Perform/monitor CIWA, COWS, AIMS and Fall Risk screenings as ordered, Perform wound care treatments as ordered.  Evaluation of Outcomes: Progressing   LCSW Treatment Plan for Primary Diagnosis: <principal problem not specified> Long Term Goal(s): Safe transition to appropriate next level of care at discharge, Engage patient in therapeutic group addressing interpersonal concerns.  Short Term Goals: Engage patient in aftercare planning with referrals and resources, Facilitate patient progression through stages of change regarding substance use diagnoses and concerns, Identify triggers associated with mental health/substance abuse issues and Increase skills for wellness and recovery  Therapeutic Interventions: Assess  for all discharge needs, 1 to 1 time with Child psychotherapist, Explore available resources and support systems, Assess for adequacy in community support network, Educate family and significant other(s) on suicide prevention, Complete Psychosocial Assessment, Interpersonal group therapy.  Evaluation of Outcomes: Progressing   Progress in Treatment: Attending groups: Yes. Participating in groups: Yes. Taking medication as prescribed: Yes. Toleration medication: Yes. Family/Significant other contact made: No, will contact:  mother Patient understands diagnosis: Yes. Discussing patient identified problems/goals with staff: Yes. Medical problems stabilized or resolved: Yes. Denies suicidal/homicidal ideation: Yes. Issues/concerns per patient self-inventory: No.  New problem(s)  identified: No, Describe:  none  New Short Term/Long Term Goal(s): detox, medication management for mood stabilization; elimination of SI thoughts; development of comprehensive mental wellness/sobriety plan.  Patient Goals: Maintain sobriety  Discharge Plan or Barriers: Patient expected to discharge over the weekend and return home. He has an intake appointment for [facility] at the [facility] on 03/02.  Reason for Continuation of Hospitalization: Depression Withdrawal symptoms  Estimated Length of Stay: Expected discharge 09/16/18  Attendees: Patient: Craig Howe  09/14/2018 8:57 AM  Physician:  09/14/2018 8:57 AM  Nursing:  09/14/2018 8:57 AM  RN Care Manager: 09/14/2018 8:57 AM  Social Worker: Enid Cutter, Theresia Majors 09/14/2018 8:57 AM  Recreational Therapist:  09/14/2018 8:57 AM  Other:  09/14/2018 8:57 AM  Other:  09/14/2018 8:57 AM  Other: 09/14/2018 8:57 AM    Scribe for Treatment Team: Darreld Mclean, LCSWA 09/14/2018 8:57 AM

## 2018-09-14 NOTE — Progress Notes (Signed)
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  Has a history of PTSD by report, was admitted for detox and stabilization  Patient states he slept believes detox measures are adequate and has no cravings or withdrawal symptoms this morning denies wanting to harm self or others mood is still a bit flat and again no tremors no involuntary movements no psychosis or seizure activity   Principal Problem: <principal problem not specified> Diagnosis: Active Problems:   Opioid dependence in remission (HCC)   Opiate dependence (HCC)   MDD (major depressive disorder), recurrent episode, severe (HCC)  Total Time spent with patient: 20 minutes  Past Medical History:  Past Medical History:  Diagnosis Date  . ADHD   . Anxiety   . GERD (gastroesophageal reflux disease)   . Major depressive disorder   . Substance abuse [facility])     Past Surgical History:  Procedure Laterality Date  . HERNIA REPAIR    . KNEE SURGERY     Family History: History reviewed. No pertinent family history. F Social History:  Social History   Substance and Sexual Activity  Alcohol Use No  . Frequency: Never     Social History   Substance and Sexual Activity  Drug Use Yes  . Types: Marijuana, Heroin   Comment: heroin last used yesterday, last used THC one week ago    Social History   Socioeconomic History  . Marital status: Single    Spouse name: Not on file  . Number of children: Not on file  . Years of education: Not on file  . Highest education level: Not on file  Occupational History  . Not on file  Social Needs  . Financial resource strain: Not on file  . Food insecurity:    Worry: Not on file    Inability: Not on file  . Transportation needs:    Medical: Not on file    Non-medical: Not on file  Tobacco Use  . Smoking status: Former  Games developer  . Smokeless tobacco: Former Neurosurgeon    Types: Chew    Quit date: 09/13/2018  Substance and Sexual Activity  . Alcohol use: No    Frequency: Never  . Drug use: Yes    Types: Marijuana, Heroin    Comment: heroin last used yesterday, last used THC one week ago  . Sexual activity: Yes    Birth control/protection: None  Lifestyle  . Physical activity:    Days per week: Not on file    Minutes per session: Not on file  . Stress: Not on file  Relationships  . Social connections:    Talks on phone: Not on file    Gets together: Not on file    Attends religious service: Not on file    Active member of club or organization: Not on file    Attends meetings of clubs or organizations: Not on file    Relationship status: Not on file  Other Topics Concern  . Not on file  Social History Narrative  . Not on file   Additional Social History:    Pain Medications: see MAR Prescriptions: see MAR Over the Counter: see MAR History of alcohol / drug use?: Yes Longest period of sobriety (when/how long): 8 months Negative Consequences of Use: Financial Withdrawal Symptoms: Cramps, Tremors  Sleep: Fair  Appetite:  Fair  Current Medications: Current Facility-Administered Medications  Medication Dose Route Frequency Provider Last Rate Last Dose  . acetaminophen (TYLENOL) tablet 650 mg  650 mg Oral Q6H PRN Kerry Hough, PA-C      . alum & mag hydroxide-simeth (MAALOX/MYLANTA) 200-200-20 MG/5ML suspension 30 mL  30 mL Oral Q4H PRN Kerry Hough, PA-C      . cloNIDine (CATAPRES) tablet 0.1 mg  0.1 mg Oral QID Donell Sievert E, PA-C   0.1 mg at 09/13/18 1636   Followed by  . [START ON 09/15/2018] cloNIDine (CATAPRES) tablet 0.1 mg  0.1 mg Oral BH-qamhs Simon, Spencer E, PA-C       Followed by  . [START ON 09/18/2018] cloNIDine (CATAPRES) tablet 0.1 mg  0.1 mg Oral QAC breakfast Donell Sievert E, PA-C      . dicyclomine (BENTYL) tablet 20 mg  20 mg Oral Q6H PRN  Donell Sievert E, PA-C   20 mg at 09/13/18 1827  . famotidine (PEPCID) tablet 40 mg  40 mg Oral BID Malvin Johns, MD   40 mg at 09/13/18 1627  . gabapentin (NEURONTIN) capsule 300 mg  300 mg Oral TID Malvin Johns, MD   300 mg at 09/13/18 1627  . hydrOXYzine (ATARAX/VISTARIL) tablet 25 mg  25 mg Oral Q6H PRN Kerry Hough, PA-C      . hydrOXYzine (ATARAX/VISTARIL) tablet 25 mg  25 mg Oral Q6H PRN Malvin Johns, MD   25 mg at 09/13/18 2152  . loperamide (IMODIUM) capsule 2-4 mg  2-4 mg Oral PRN Donell Sievert E, PA-C   4 mg at 09/13/18 1425  . magnesium hydroxide (MILK OF MAGNESIA) suspension 30 mL  30 mL Oral Daily PRN Kerry Hough, PA-C      . methocarbamol (ROBAXIN) tablet 500 mg  500 mg Oral Q8H PRN Kerry Hough, PA-C      . methocarbamol (ROBAXIN) tablet 750 mg  750 mg Oral TID Malvin Johns, MD   750 mg at 09/13/18 1628  . naproxen (NAPROSYN) tablet 500 mg  500 mg Oral BID PRN Kerry Hough, PA-C   500 mg at 09/13/18 1827  . nicotine polacrilex (NICORETTE) gum 2 mg  2 mg Oral PRN Malvin Johns, MD   2 mg at 09/13/18 2154  . ondansetron (ZOFRAN-ODT) disintegrating tablet 4 mg  4 mg Oral Q6H PRN Kerry Hough, PA-C      . primidone (MYSOLINE) tablet 50 mg  50 mg Oral BID Malvin Johns, MD   50 mg at 09/13/18 2151  . temazepam (RESTORIL) capsule 30 mg  30 mg Oral QHS Malvin Johns, MD   30 mg at 09/13/18 2152  . traZODone (DESYREL) tablet 100 mg  100 mg Oral QHS,MR X 1 Kerry Hough, PA-C   100 mg at 09/13/18 2152  . traZODone (DESYREL) tablet 200 mg  200 mg Oral QHS PRN Malvin Johns, MD        Lab Results:  Results for orders placed or performed during the hospital encounter of 09/12/18 (from the past 48 hour(s))  Comprehensive metabolic panel     Status: None   Collection Time: 09/12/18 10:48 PM  Result Value Ref Range   Sodium 138 135 - 145 mmol/L   Potassium 3.8 3.5 - 5.1 mmol/L   Chloride 104 98 - 111 mmol/L   CO2 26 22 - 32 mmol/L   Glucose, Bld 98 70 - 99 mg/dL    BUN 9 6 -  20 mg/dL   Creatinine, Ser 4.09 0.61 - 1.24 mg/dL   Calcium 9.3 8.9 - 81.1 mg/dL   Total Protein 7.8 6.5 - 8.1 g/dL   Albumin 4.7 3.5 - 5.0 g/dL   AST 24 15 - 41 U/L   ALT 17 0 - 44 U/L   Alkaline Phosphatase 40 38 - 126 U/L   Total Bilirubin 1.1 0.3 - 1.2 mg/dL   GFR calc non Af Amer >60 >60 mL/min   GFR calc Af Amer >60 >60 mL/min   Anion gap 8 5 - 15    Comment: Performed at [facility], [address], [place], [place] [address]  Ethanol     Status: None   Collection Time: 09/12/18 10:48 PM  Result Value Ref Range   Alcohol, Ethyl (B) <10 <10 mg/dL    Comment: (NOTE) Lowest detectable limit for serum alcohol is 10 mg/dL. For medical purposes only. Performed at [facility], [address] [place], [place] [address]   Salicylate level     Status: None   Collection Time: 09/12/18 10:48 PM  Result Value Ref Range   Salicylate Lvl <7.0 2.8 - 30.0 mg/dL    Comment: Performed at [facility], [address], [place], [place] [address]  Acetaminophen level     Status: Abnormal   Collection Time: 09/12/18 10:48 PM  Result Value Ref Range   Acetaminophen (Tylenol), Serum <10 (L) 10 - 30 ug/mL    Comment: (NOTE) Therapeutic concentrations vary significantly. A range of 10-30 ug/mL  may be an effective concentration for many patients. However, some  are best treated at concentrations outside of this range. Acetaminophen concentrations >150 ug/mL at 4 hours after ingestion  and >50 ug/mL at 12 hours after ingestion are often associated with  toxic reactions. Performed at [facility], [address] [place], [place] [address]   cbc     Status: None   Collection Time: 09/12/18 10:48 PM  Result Value Ref Range   WBC 6.3 4.0 - 10.5 K/uL   RBC 5.33 4.22 - 5.81 MIL/uL   Hemoglobin 16.1 13.0 - 17.0 g/dL   HCT 96.2 95.2 - 84.1 %   MCV 90.6 80.0 - 100.0 fL   MCH 30.2 26.0 - 34.0 pg   MCHC 33.3  30.0 - 36.0 g/dL   RDW 32.4 40.1 - 02.7 %   Platelets 227 150 - 400 K/uL   nRBC 0.0 0.0 - 0.2 %    Comment: Performed at [facility], [address], [place], [place] [address]  Rapid urine drug screen (hospital performed)     Status: Abnormal   Collection Time: 09/12/18 10:49 PM  Result Value Ref Range   Opiates POSITIVE (A) NONE DETECTED   Cocaine NONE DETECTED NONE DETECTED   Benzodiazepines NONE DETECTED NONE DETECTED   Amphetamines NONE DETECTED NONE DETECTED   Tetrahydrocannabinol POSITIVE (A) NONE DETECTED   Barbiturates NONE DETECTED NONE DETECTED    Comment: (NOTE) DRUG SCREEN FOR MEDICAL PURPOSES ONLY.  IF CONFIRMATION IS NEEDED FOR ANY PURPOSE, NOTIFY LAB WITHIN 5 DAYS. LOWEST DETECTABLE LIMITS FOR URINE DRUG SCREEN Drug Class                     Cutoff (ng/mL) Amphetamine and metabolites    1000 Barbiturate and metabolites    200 Benzodiazepine                 200 Tricyclics  and metabolites     300 Opiates and metabolites        300 Cocaine and metabolites        300 THC                            50 Performed at [facility], [address], [place], [place] [address]     Blood Alcohol level:  Lab Results  Component Value Date   ETH <10 09/12/2018   ETH <10 04/05/2018    Metabolic Disorder Labs: No results found for: HGBA1C, MPG No results found for: PROLACTIN No results found for: CHOL, TRIG, HDL, CHOLHDL, VLDL, LDLCALC  Physical Findings: AIMS: Facial and Oral Movements Muscles of Facial Expression: None, normal Lips and Perioral Area: None, normal Jaw: None, normal Tongue: None, normal,Extremity Movements Upper (arms, wrists, hands, fingers): None, normal Lower (legs, knees, ankles, toes): None, normal, Trunk Movements Neck, shoulders, hips: None, normal, Overall Severity Severity of abnormal movements (highest score from questions above): None, normal Incapacitation due to abnormal movements: None,  normal Patient's awareness of abnormal movements (rate only patient's report): No Awareness, Dental Status Current problems with teeth and/or dentures?: No Does patient usually wear dentures?: No  CIWA:  CIWA-Ar Total: 4 COWS:  COWS Total Score: 3  Musculoskeletal: Strength & Muscle Tone: within normal limits Gait & Station: normal Patient leans: N/A  Psychiatric Specialty Exam: Physical Exam blood pressure running low probably due to Catapres protocol  ROS no tremors or seizure activity  Blood pressure (!) 105/59, pulse 64, temperature 98.1 F (36.7 C), resp. rate 16, height 6\' 2"  (1.88 m), weight 96.2 kg, SpO2 100 %.Body mass index is 27.22 kg/m.  General Appearance: Casual  Eye Contact:  Good  Speech:  Clear and Coherent  Volume:  Normal  Mood:  Dysphoric  Affect:  Constricted  Thought Process:  Coherent  Orientation:  Full (Time, Place, and Person)  Thought Content:  Logical  Suicidal Thoughts:  No  Homicidal Thoughts:  No  Memory:  Immediate;   Fair  Judgement:  Fair  Insight:  Good  Psychomotor Activity:  Normal  Concentration:  Concentration: Good  Recall:  Good  Fund of Knowledge:  Good  Language:  nl  Akathisia:  Negative  Handed:  Right  AIMS (if indicated):     Assets:  Physical Health Resilience Social Support Talents/Skills Transportation  ADL's:  Intact  Cognition:  WNL  Sleep:  Number of Hours: 6     Treatment Plan Summary: Daily contact with patient to assess and evaluate symptoms and progress in treatment, Medication management and Plan Continue current detox measures current precautions and cognitive and rehab based therapies probable discharge by Monday  Malvin Johns, MD 09/14/2018, 8:13 AM

## 2018-09-14 NOTE — Progress Notes (Signed)
Recreation Therapy Notes  Date:  2.28.20 Time: 0930 Location: 300 Hall Dayroom  Group Topic: Stress Management  Goal Area(s) Addresses:  Patient will identify positive stress management techniques. Patient will identify benefits of using stress management post d/c.  Behavioral Response: Engaged  Intervention: Stress Management  Activity :  Progressive Muscle Relaxation.  LRT introduced the stress management technique of stress management.  LRT read a script that focused on tensing and relaxing each muscle group individually.  Patients were to follow along as the script was read to engage in activity.  Education:  Stress Management, Discharge Planning.   Education Outcome: Acknowledges Education  Clinical Observations/Feedback:  Pt attended and participated in group.    William Schake, LRT/CTRS         Kelani Robart A 09/14/2018 10:47 AM 

## 2018-09-14 NOTE — B[facility] Group Notes (Signed)
LCSW Group Therapy Note  09/14/2018 3:58 PM  Type of Therapy and Topic: Group Therapy: Avoiding Self-Sabotaging and Enabling Behaviors  Participation Level: Active  Description of Group:  In this group, patients will learn how to identify obstacles, self-sabotaging and enabling behaviors, as well as: what are they, why do we do them and what needs these behaviors meet. Discuss unhealthy relationships and how to have positive healthy boundaries with those that sabotage and enable. Explore aspects of self-sabotage and enabling in yourself and how to limit these self-destructive behaviors in everyday life.  Therapeutic Goals: 1. Patient will identify one obstacle that relates to self-sabotage and enabling behaviors 2. Patient will identify one personal self-sabotaging or enabling behavior they did prior to admission 3. Patient will state a plan to change the above identified behavior 4. Patient will demonstrate ability to communicate their needs through discussion and/or role play.   Summary of Patient Progress:  Patient shared procrastination and manipulating others are self-sabotaging behaviors he engages in.   Therapeutic Modalities:  Cognitive Behavioral Therapy Person-Centered Therapy Motivational Interviewing  Enid Cutter, MSW, [employer] Clinical Social Worker

## 2018-09-14 NOTE — Progress Notes (Signed)
D: Pt denies SI/HI/AVH. Pt is pleasant and cooperative. Pt stated he was anxious earlier, pt stated he was tired of the cycle he was in, pt stated he was planning to hopefully make positive changes on D/C.   A: Pt was offered support and encouragement. Pt was given scheduled medications. Pt was encourage to attend groups. Q 15 minute checks were done for safety.   R:Pt attends groups and interacts well with peers and staff. Pt is taking medication. Pt has no complaints.Pt receptive to treatment and safety maintained on unit.

## 2018-09-14 NOTE — Progress Notes (Signed)
D Pt is observed OOB AUL on the 300 hall today...he tolerates this fairly well. Inititally, he did not want to get OOB and resisted writer's suggestion that he actually get up. He endorses an irritable, anxious affect and he says " hey lady.Craig Howe get home  SUnday.Marland Kitchenas soon as possible!!!"      A Pt completed his daily assessment and on this he wrote he denied SI and he rated his depression, hopleessness and anxiety " 6/5/8", respectively.    R Safety in [place]

## 2018-09-15 DIAGNOSIS — F332 Major depressive disorder, recurrent severe without psychotic features: Secondary | ICD-10-CM

## 2018-09-15 DIAGNOSIS — F1120 Opioid dependence, uncomplicated: Secondary | ICD-10-CM

## 2018-09-15 MED ORDER — DIVALPROEX SODIUM ER 250 MG PO TB24
250.0000 mg | ORAL_TABLET | Freq: Every day | ORAL | Status: DC
  Administered 2018-09-15 – 2018-09-16 (×2): 250 mg via ORAL
  Filled 2018-09-15 (×4): qty 1

## 2018-09-15 MED ORDER — DIVALPROEX SODIUM ER 250 MG PO TB24
250.0000 mg | ORAL_TABLET | Freq: Every day | ORAL | Status: DC
  Administered 2018-09-15: 250 mg via ORAL
  Filled 2018-09-15 (×3): qty 1

## 2018-09-15 NOTE — Progress Notes (Signed)
[facility] MD Progress Note  09/15/2018 12:02 PM Craig Howe  MRN:  [id number] Subjective:  Craig Howe " I need to be restarted on Vivtrol and Depakote"  Evaluation: Patient observed sitting in day room interacting with peers.  He reports attending daily group sessions with active and engaged participation.  Patient reports " I was told that I was discharging tomorrow, I need to get back to work."  NP to follow-up with attending psychiatrist.  patient then became anxious and irritable during assessment.  He denies suicidal or homicidal ideations.  Denies auditory or visual hallucinations.  NP will agree to restart Depakote 250mg  nightly.  Patient to follow-up with primary care provider for ulcer discomfort.  Continue Pepcid 40 mg p.o. twice daily.  Reports a good appetite.  States he is resting well throughout the night.  Support encouragement reassurance was provided.  History: This is the latest of multiple admissions/mental health encounters for Mr. Both, he reports no prior admissions here however.  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 States that now when he uses heroin he has about 10 minutes of a "buzz" but then he feels "like crap" Is ready to get clean -feels it is "not worth it" to continue dependent upon opiates.     Principal Problem: <principal problem not specified> Diagnosis: Active Problems:   Opioid dependence in remission (HCC)   Opiate dependence (HCC)   MDD (major depressive disorder), recurrent episode, severe (HCC)  Total Time spent with patient: 20 minutes  Past Medical History:  Past Medical History:  Diagnosis Date  . ADHD   . Anxiety   . GERD (gastroesophageal reflux disease)   . Major depressive disorder   . Substance abuse [facility])     Past Surgical History:   Procedure Laterality Date  . HERNIA REPAIR    . KNEE SURGERY     Family History: History reviewed. No pertinent family history. F Social History:  Social History   Substance and Sexual Activity  Alcohol Use No  . Frequency: Never     Social History   Substance and Sexual Activity  Drug Use Yes  . Types: Marijuana, Heroin   Comment: heroin last used yesterday, last used THC one week ago    Social History   Socioeconomic History  . Marital status: Single    Spouse name: Not on file  . Number of children: Not on file  . Years of education: Not on file  . Highest education level: Not on file  Occupational History  . Not on file  Social Needs  . Financial resource strain: Not on file  . Food insecurity:    Worry: Not on file    Inability: Not on file  . Transportation needs:    Medical: Not on file    Non-medical: Not on file  Tobacco Use  . Smoking status: Former Games developer  . Smokeless tobacco: Former Neurosurgeon    Types: Chew    Quit date: 09/13/2018  Substance and Sexual Activity  . Alcohol use: No    Frequency: Never  . Drug use: Yes    Types: Marijuana, Heroin    Comment: heroin last used yesterday, last used THC one week ago  . Sexual activity: Yes    Birth control/protection: None  Lifestyle  . Physical activity:    Days per week: Not on file    Minutes per session: Not on file  . Stress: Not on file  Relationships  . Social connections:    Talks on phone: Not on file    Gets together: Not on file    Attends religious service: Not on file    Active member of club or organization: Not on file    Attends meetings of clubs or organizations: Not on file    Relationship status: Not on file  Other Topics Concern  . Not on file  Social History Narrative  . Not on file   Additional Social History:    Pain Medications: see MAR Prescriptions: see MAR Over the Counter: see MAR History of alcohol / drug use?: Yes Longest period of sobriety (when/how long): 8  months Negative Consequences of Use: Financial Withdrawal Symptoms: Cramps, Tremors                    Sleep: Fair  Appetite:  Fair  Current Medications: Current Facility-Administered Medications  Medication Dose Route Frequency Provider Last Rate Last Dose  . acetaminophen (TYLENOL) tablet 650 mg  650 mg Oral Q6H PRN Kerry Hough, PA-C      . alum & mag hydroxide-simeth (MAALOX/MYLANTA) 200-200-20 MG/5ML suspension 30 mL  30 mL Oral Q4H PRN Donell Sievert E, PA-C      . cloNIDine (CATAPRES) tablet 0.1 mg  0.1 mg Oral BH-qamhs Simon, Spencer E, PA-C       Followed by  . [START ON 09/18/2018] cloNIDine (CATAPRES) tablet 0.1 mg  0.1 mg Oral QAC breakfast Donell Sievert E, PA-C      . dicyclomine (BENTYL) tablet 20 mg  20 mg Oral Q6H PRN Antonieta Pert, MD   20 mg at 09/14/18 1437  . divalproex (DEPAKOTE ER) 24 hr tablet 250 mg  250 mg Oral Daily Oneta Rack, NP      . divalproex (DEPAKOTE ER) 24 hr tablet 250 mg  250 mg Oral QHS Antonieta Pert, MD      . famotidine (PEPCID) tablet 40 mg  40 mg Oral BID Malvin Johns, MD   40 mg at 09/15/18 0853  . gabapentin (NEURONTIN) capsule 300 mg  300 mg Oral TID Malvin Johns, MD   300 mg at 09/15/18 0853  . hydrOXYzine (ATARAX/VISTARIL) tablet 25 mg  25 mg Oral Q6H PRN Kerry Hough, PA-C   25 mg at 09/14/18 1043  . hydrOXYzine (ATARAX/VISTARIL) tablet 25 mg  25 mg Oral Q6H PRN Malvin Johns, MD   25 mg at 09/14/18 1809  . loperamide (IMODIUM) capsule 2-4 mg  2-4 mg Oral PRN Antonieta Pert, MD   4 mg at 09/13/18 1425  . magnesium hydroxide (MILK OF MAGNESIA) suspension 30 mL  30 mL Oral Daily PRN Donell Sievert E, PA-C      . methocarbamol (ROBAXIN) tablet 500 mg  500 mg Oral Q8H PRN Antonieta Pert, MD   500 mg at 09/14/18 1716  . methocarbamol (ROBAXIN) tablet 750 mg  750 mg Oral TID Malvin Johns, MD   750 mg at 09/15/18 1610  . naproxen (NAPROSYN) tablet 500 mg  500 mg Oral BID PRN Antonieta Pert, MD   500 mg at  09/13/18 1827  . nicotine polacrilex (NICORETTE) gum 2 mg  2 mg Oral PRN Malvin Johns, MD   2 mg at 09/14/18 1807  . ondansetron (ZOFRAN-ODT) disintegrating tablet  4 mg  4 mg Oral Q6H PRN Donell Sievert E, PA-C      . temazepam (RESTORIL) capsule 30 mg  30 mg Oral QHS Malvin Johns, MD   30 mg at 09/14/18 2120  . traZODone (DESYREL) tablet 100 mg  100 mg Oral QHS,MR X 1 Kerry Hough, PA-C   100 mg at 09/14/18 2121    Lab Results:  No results found for this or any previous visit (from the past 48 hour(s)).  Blood Alcohol level:  Lab Results  Component Value Date   ETH <10 09/12/2018   ETH <10 04/05/2018    Metabolic Disorder Labs: No results found for: HGBA1C, MPG No results found for: PROLACTIN No results found for: CHOL, TRIG, HDL, CHOLHDL, VLDL, LDLCALC  Physical Findings: AIMS: Facial and Oral Movements Muscles of Facial Expression: None, normal Lips and Perioral Area: None, normal Jaw: None, normal Tongue: None, normal,Extremity Movements Upper (arms, wrists, hands, fingers): None, normal Lower (legs, knees, ankles, toes): None, normal, Trunk Movements Neck, shoulders, hips: None, normal, Overall Severity Severity of abnormal movements (highest score from questions above): None, normal Incapacitation due to abnormal movements: None, normal Patient's awareness of abnormal movements (rate only patient's report): No Awareness, Dental Status Current problems with teeth and/or dentures?: No Does patient usually wear dentures?: No  CIWA:  CIWA-Ar Total: 4 COWS:  COWS Total Score: 0  Musculoskeletal: Strength & Muscle Tone: within normal limits Gait & Station: normal Patient leans: N/A  Psychiatric Specialty Exam: Physical Exam  Constitutional: He is oriented to person, place, and time. He appears well-developed.  Neurological: He is alert and oriented to person, place, and time.  Psychiatric: He has a normal mood and affect. His behavior is normal.     Review of  Systems  Psychiatric/Behavioral: Positive for depression and substance abuse. Negative for hallucinations and suicidal ideas. The patient is nervous/anxious. The patient does not have insomnia.   All other systems reviewed and are negative.   Blood pressure 118/77, pulse (!) 58, temperature 98.1 F (36.7 C), resp. rate 16, height 6\' 2"  (1.88 m), weight 96.2 kg, SpO2 100 %.Body mass index is 27.22 kg/m.  General Appearance: Casual  Eye Contact:  Good  Speech:  Clear and Coherent  Volume:  Normal  Mood:  Dysphoric  Affect:  Constricted  Thought Process:  Coherent  Orientation:  Full (Time, Place, and Person)  Thought Content:  Logical  Suicidal Thoughts:  No  Homicidal Thoughts:  No  Memory:  Immediate;   Fair  Judgement:  Fair  Insight:  Good  Psychomotor Activity:  Normal  Concentration:  Concentration: Good  Recall:  Good  Fund of Knowledge:  Good  Language:  nl  Akathisia:  Negative  Handed:  Right  AIMS (if indicated):     Assets:  Physical Health Resilience Social Support Talents/Skills Transportation  ADL's:  Intact  Cognition:  WNL  Sleep:  Number of Hours: 6     Treatment Plan Summary: Daily contact with patient to assess and evaluate symptoms and progress in treatment and Medication management   Continue with current treatment plan on 09/15/2018 as listed below except were noted  Mood stabilization:  Continue Restoril 30 mg p.o. nightly Continue Neurontin 300 mg p.o. 3 times daily Initiated Depakote 250 p.o. nightly  Opiate detox protocol /COWS  CSW to continue working on discharge disposition Patient encouraged to participate within the milieu  Oneta Rack, NP 09/15/2018, 12:02 PM

## 2018-09-15 NOTE — Progress Notes (Signed)
Adult Psychoeducational Group Note  Date:  09/15/2018 Time:  5:22 PM  Group Topic/Focus:  Goals Group:   The focus of this group is to help patients establish daily goals to achieve during treatment and discuss how the patient can incorporate goal setting into their daily lives to aide in recovery.  Participation Level:  Active  Participation Quality:  Appropriate  Affect:  Appropriate  Cognitive:  Alert  Insight: Appropriate  Engagement in Group:  Engaged  Modes of Intervention:  Discussion  Additional Comments:  Pt attended group and participated in discussion.  Lajoyce Tamura R Genaro Bekker 09/15/2018, 5:22 PM

## 2018-09-15 NOTE — B[facility] Group Notes (Signed)
LCSW Group Therapy Note  09/15/2018   10:00-11:00am   Type of Therapy and Topic:  Group Therapy: Anger Cues and Responses  Participation Level:  Active   Description of Group:   In this group, patients learned how to recognize the physical, cognitive, emotional, and behavioral responses they have to anger-provoking situations.  They identified a recent time they became angry and how they reacted.  They analyzed how their reaction was possibly beneficial and how it was possibly unhelpful.  The group discussed a variety of healthier coping skills that could help with such a situation in the future.  Deep breathing was practiced briefly.  Therapeutic Goals: 1. Patients will remember their last incident of anger and how they felt emotionally and physically, what their thoughts were at the time, and how they behaved. 2. Patients will identify how their behavior at that time worked for them, as well as how it worked against them. 3. Patients will explore possible new behaviors to use in future anger situations. 4. Patients will learn that anger itself is normal and cannot be eliminated, and that healthier reactions can assist with resolving conflict rather than worsening situations.  Summary of Patient Progress:  The patient shared that his most recent time of anger was this morning and said he was angered because it looked like his discharge plan had changed, but this ended up being a misunderstanding.  He stated that he stayed calm, but did ask multiple people until he got the answers he needed.  Therapeutic Modalities:   Cognitive Behavioral Therapy  Lynnell Chad

## 2018-09-15 NOTE — Progress Notes (Signed)
D: Pt denies SI/HI/AVH. Pt is pleasant and cooperative. Pt stated he was ready for D/C in the am A: Pt was offered support and encouragement. Pt was given scheduled medications. Pt was encourage to attend groups. Q 15 minute checks were done for safety.  R:Pt attends groups and interacts well with peers and staff. Pt is taking medication. Pt has no complaints.Pt receptive to treatment and safety maintained on unit.

## 2018-09-15 NOTE — Progress Notes (Signed)
D pt is observed OOB UAL on the 300 hall today, tolerated well. He is less anxious, but presents depressed with a flat affect. HE interacts with peers and staff  Appropriately, attending his groups and taking his scheduled meds as planned.      A He completed his daily assessment and on this he wrote he denied SI today and he rated his depression, hopelessness and anxiety " 4/3/7", respectively. Writer spoke with pt about his pending dc home tomorrow am. Engaged in learing helathier coping skills. Therapeutic alliance fostered.     R Safety in place.

## 2018-09-15 NOTE — B[facility] Suicide Risk Assessment (Signed)
[facility] INPATIENT:  Family/Significant Other Suicide Prevention Education  Suicide Prevention Education:  Education Completed; mother, Kerrie Murie [phone],  (name of family member/significant other) has been identified by the patient as the family member/significant other with whom the patient will be residing, and identified as the person(s) who will aid the patient in the event of a mental health crisis (suicidal ideations/suicide attempt).  With written consent from the patient, the family member/significant other has been provided the following suicide prevention education, prior to the and/or following the discharge of the patient.  The suicide prevention education provided includes the following:  Suicide risk factors  Suicide prevention and interventions  National Suicide Hotline telephone number  [facility] assessment telephone number  [facility] Emergency Assistance 911  [facility] and/or Residential Mobile Crisis Unit telephone number  Request made of family/significant other to:  Remove weapons (e.g., guns, rifles, knives), all items previously/currently identified as safety concern.    Remove drugs/medications (over-the-counter, prescriptions, illicit drugs), all items previously/currently identified as a safety concern.  The family member/significant other verbalizes understanding of the suicide prevention education information provided.  The family member/significant other agrees to remove the items of safety concern listed above.  Mother reports patient does not need to come back here. He needs to go to an [facility]. He needs counseling and meetings and I have been saying that since he moved back here. He won't forgive himself for some things and don't want to grow up and take responsibility. Mother reports there are guns in the home but they are locked in a safe and he does not have access to get in.   Shellia Cleverly 09/15/2018, 8:55 AM

## 2018-09-16 MED ORDER — FAMOTIDINE 40 MG PO TABS: 40.0000 mg | ORAL_TABLET | Freq: Two times a day (BID) | ORAL | 2 refills | Status: DC

## 2018-09-16 MED ORDER — DIVALPROEX SODIUM ER 500 MG PO TB24: 500.0000 mg | ORAL_TABLET | Freq: Every day | ORAL | 2 refills | Status: DC

## 2018-09-16 MED ORDER — GABAPENTIN 300 MG PO CAPS: 300.0000 mg | ORAL_CAPSULE | Freq: Three times a day (TID) | ORAL | 2 refills | Status: DC

## 2018-09-16 NOTE — Discharge Summary (Signed)
Physician Discharge Summary Note  Patient:  Craig Howe is an 28 y.o., male MRN:  [id number] DOB:  May 09, 1991 Patient phone:  [phone] (home)  Patient address:   [address] [place] [address],  Total Time spent with patient: 45 minutes  Date of Admission:  09/13/2018 Date of Discharge: 09/16/18  Reason for Admission:    History of Present Illness:   This is the latest of multiple admissions/mental health encounters for [name], he reports no prior admissions here however.  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 States that now when he uses heroin he has about 10 minutes of a "buzz" but then he feels "like crap" Is ready to get clean -feels it is "not worth it" to continue dependent upon opiates. While impaired patient reportedly banged his head on the concrete and attempt to harm himself he denies that he was trying to harm himself states he "just fell down" while he was high  The patient again has had numerous treatments including rehab and recently in [facility] and was released 8 months ago. 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 He reports recent insomnia Reports upper abdominal pain sort of centrally located that he states is consistent with his past peptic ulcer  disease  Somewhat med seeking during interview wanting "a little Suboxone"..."something to sleep like Ativan" states he simply wants his anxiety treated and then he will be fine.  Principal Problem: Opioid dependence in remission [facility]) Discharge Diagnoses: Principal Problem:   Opioid dependence in remission [facility]) Active Problems:   Opiate dependence (HCC)   MDD (major depressive disorder), recurrent episode, severe (HCC)   Past Psychiatric History: several txts  Past Medical History:  Past Medical History:  Diagnosis Date  . ADHD   . Anxiety   . GERD (gastroesophageal reflux disease)   . Major depressive disorder   . Substance abuse [facility])     Past Surgical History:  Procedure Laterality Date  . HERNIA REPAIR    . KNEE SURGERY     Family History: History reviewed. No pertinent family history. Family Psychiatric  History: neg Social History:  Social History   Substance and Sexual Activity  Alcohol Use No  . Frequency: Never     Social History   Substance and Sexual Activity  Drug Use Yes  . Types: Marijuana, Heroin   Comment: heroin last used yesterday, last used THC one week ago    Social History   Socioeconomic History  . Marital status: Single    Spouse name: Not on file  . Number of children: Not on file  . Years of education: Not on file  . Highest education level: Not on file  Occupational History  . Not on file  Social Needs  . Financial resource strain: Not on file  .  Food insecurity:    Worry: Not on file    Inability: Not on file  . Transportation needs:    Medical: Not on file    Non-medical: Not on file  Tobacco Use  . Smoking status: Former Games developermoker  . Smokeless tobacco: Former NeurosurgeonUser    Types: Chew    Quit date: 09/13/2018  Substance and Sexual Activity  . Alcohol use: No    Frequency: Never  . Drug use: Yes    Types: Marijuana, Heroin    Comment: heroin last used yesterday, last used THC one week ago  . Sexual activity: Yes    Birth  control/protection: None  Lifestyle  . Physical activity:    Days per week: Not on file    Minutes per session: Not on file  . Stress: Not on file  Relationships  . Social connections:    Talks on phone: Not on file    Gets together: Not on file    Attends religious service: Not on file    Active member of club or organization: Not on file    Attends meetings of clubs or organizations: Not on file    Relationship status: Not on file  Other Topics Concern  . Not on file  Social History Narrative  . Not on file    Hospital Course:   Once here the patient's detox was very successful he had minimal withdrawal symptoms of any he denied suicidal thoughts throughout his stay.  Was noted to be at times drug-seeking and at times irritable but overall these were minor symptoms compared to the extensiveness of his past pathology.  By the date of the first he was requesting discharge he denied cravings he had no thoughts of harming self or others was eager to get back to work.  Alert oriented cooperative no thoughts of harming self no thoughts of harming anyone else no cravings tremors or withdrawal symptoms stable for release.  Physical Findings: AIMS: Facial and Oral Movements Muscles of Facial Expression: None, normal Lips and Perioral Area: None, normal Jaw: None, normal Tongue: None, normal,Extremity Movements Upper (arms, wrists, hands, fingers): None, normal Lower (legs, knees, ankles, toes): None, normal, Trunk Movements Neck, shoulders, hips: None, normal, Overall Severity Severity of abnormal movements (highest score from questions above): None, normal Incapacitation due to abnormal movements: None, normal Patient's awareness of abnormal movements (rate only patient's report): No Awareness, Dental Status Current problems with teeth and/or dentures?: No Does patient usually wear dentures?: No  CIWA:  CIWA-Ar Total: 4 COWS:  COWS Total Score: 0  Musculoskeletal: Strength & Muscle  Tone: within normal limits Gait & Station: normal Patient leans: N/A  Psychiatric Specialty Exam: Physical Exam  ROS  Blood pressure 131/88, pulse 70, temperature 98.1 F (36.7 C), resp. rate 20, height 6\' 2"  (1.88 m), weight 96.2 kg, SpO2 100 %.Body mass index is 27.22 kg/m.  General Appearance: Casual  Eye Contact:  Good  Speech:  Clear and Coherent  Volume:  Normal  Mood:  Euthymic  Affect:  Appropriate  Thought Process:  Goal Directed  Orientation:  Full (Time, Place, and Person)  Thought Content:  Logical  Suicidal Thoughts:  No  Homicidal Thoughts:  No  Memory:  Immediate;   Fair  Judgement:  Good  Insight:  Good  Psychomotor Activity:  Normal  Concentration:  Concentration: Good  Recall:  Good  Fund of Knowledge:  Good  Language:  Good  Akathisia:  NA  Handed:  Right  AIMS (if  indicated):     Assets:  Leisure Time Physical Health Resilience  ADL's:  Intact  Cognition:  WNL  Sleep:  Number of Hours: 5.75     Have you used any form of tobacco in the last 30 days? (Cigarettes, Smokeless Tobacco, Cigars, and/or Pipes): Yes  Has this patient used any form of tobacco in the last 30 days? (Cigarettes, Smokeless Tobacco, Cigars, and/or Pipes) Yes, No  Blood Alcohol level:  Lab Results  Component Value Date   ETH <10 09/12/2018   ETH <10 04/05/2018    Metabolic Disorder Labs:  No results found for: HGBA1C, MPG No results found for: PROLACTIN No results found for: CHOL, TRIG, HDL, CHOLHDL, VLDL, LDLCALC  See Psychiatric Specialty Exam and Suicide Risk Assessment completed by Attending Physician prior to discharge.  Discharge destination:  Home  Is patient on multiple antipsychotic therapies at discharge:  No   Has Patient had three or more failed trials of antipsychotic monotherapy by history:  No  Recommended Plan for Multiple Antipsychotic Therapies: N/a    Allergies as of 09/16/2018   No Known Allergies     Medication List    STOP taking these  medications   ARIPiprazole 15 MG tablet Commonly known as:  ABILIFY   atomoxetine 40 MG capsule Commonly known as:  STRATTERA   divalproex 250 MG DR tablet Commonly known as:  DEPAKOTE Replaced by:  divalproex 500 MG 24 hr tablet   OLANZapine zydis 5 MG disintegrating tablet Commonly known as:  ZYPREXA   ondansetron 4 MG tablet Commonly known as:  ZOFRAN   traZODone 50 MG tablet Commonly known as:  DESYREL     TAKE these medications     Indication  divalproex 500 MG 24 hr tablet Commonly known as:  DEPAKOTE ER Take 1 tablet (500 mg total) by mouth at bedtime. Replaces:  divalproex 250 MG DR tablet  Indication:  Manic Phase of Manic-Depression   famotidine 40 MG tablet Commonly known as:  PEPCID Take 1 tablet (40 mg total) by mouth 2 (two) times daily.  Indication:  Gastroesophageal Reflux Disease   gabapentin 300 MG capsule Commonly known as:  NEURONTIN Take 1 capsule (300 mg total) by mouth 3 (three) times daily.  Indication:  Fibromyalgia Syndrome      Follow-up Information    [facility] Follow up.   Specialty:  Behavioral Health Why:  Please attend your intake appointment on Monday 09/17/2018 at 1:00pm. Please bring photo ID, insurance information, and hospital discharge paperwork. Contact information: [address] [place] [address] [phone]           Follow-up recommendations:  Activity:  full  Comments: Clear to return to work  Signed: Malvin Johns, MD 09/16/2018, 7:46 AM

## 2018-09-16 NOTE — Progress Notes (Signed)
Pt completed his daily assessment and on this he wrote he denied having SI today and he rated his depression, hopelessness and anxiety " 3/4/5", respectively. MD completed DC order, dc instructions are reviewed with pt, pt given cc ( SRA, AVS, SSP and transition record) and then all belongins are returned to pt, as well as return to work letter from [employer]. Pt escorted to bldg entrance and dc'd ambulatory per MD order.

## 2018-09-16 NOTE — BHH Suicide Risk Assessment (Signed)
[facility] Suicide Risk Assessment   Principal Problem: Opioid dependence in remission [facility]) Discharge Diagnoses: Principal Problem:   Opioid dependence in remission (HCC) Active Problems:   Opiate dependence (HCC)   MDD (major depressive disorder), recurrent episode, severe (HCC)   Total Time spent with patient: 15 minutes  Musculoskeletal: Strength & Muscle Tone: within normal limits Gait & Station: normal Patient leans: N/A  Psychiatric Specialty Exam: Review of Systems  All other systems reviewed and are negative.   Blood pressure 131/88, pulse 70, temperature 98.1 F (36.7 C), resp. rate 20, height 6\' 2"  (1.88 m), weight 96.2 kg, SpO2 100 %.Body mass index is 27.22 kg/m.  General Appearance: Casual  Eye Contact::  Good  Speech:  Normal Rate409  Volume:  Normal  Mood:  Euthymic  Affect:  Congruent  Thought Process:  Coherent and Descriptions of Associations: Intact  Orientation:  Full (Time, Place, and Person)  Thought Content:  Logical  Suicidal Thoughts:  No  Homicidal Thoughts:  No  Memory:  Immediate;   Fair Recent;   Fair Remote;   Fair  Judgement:  Intact  Insight:  Fair  Psychomotor Activity:  Normal  Concentration:  Good  Recall:  Good  Fund of Knowledge:Good  Language: Good  Akathisia:  Negative  Handed:  Right  AIMS (if indicated):     Assets:  Communication Skills Desire for Improvement Financial Resources/Insurance Housing Physical Health Resilience  Sleep:  Number of Hours: 5.75  Cognition: WNL  ADL's:  Intact   Mental Status Per Nursing Assessment::   On Admission:  Self-harm thoughts, Suicidal ideation indicated by patient  Demographic Factors:  Male and Caucasian  Loss Factors: NA  Historical Factors: Impulsivity  Risk Reduction Factors:   Sense of responsibility to family, Living with another person, especially a relative and Positive social support  Continued Clinical Symptoms:  Bipolar Disorder:   Depressive  phase Alcohol/Substance Abuse/Dependencies  Cognitive Features That Contribute To Risk:  None    Suicide Risk:  Minimal: No identifiable suicidal ideation.  Patients presenting with no risk factors but with morbid ruminations; may be classified as minimal risk based on the severity of the depressive symptoms  Follow-up Information    [facility] Follow up.   Specialty:  Behavioral Health Why:  Please attend your intake appointment on Monday 09/17/2018 at 1:00pm. Please bring photo ID, insurance information, and hospital discharge paperwork. Contact information: [address] [place] [address] [phone]           Plan Of Care/Follow-up recommendations:  Activity:  ad lib  Antonieta Pert, MD 09/16/2018, 7:44 AM

## 2018-09-16 NOTE — BHH Group Notes (Signed)
B[facility] Group Notes:  (Nursing/MHT/Case Management/Adjunct)  Date:  09/16/2018  Time:  6:22 PM  Type of Therapy:  Nurse Education  Participation Level:  Active  Participation Quality:  Appropriate and Attentive  Affect:  Appropriate  Cognitive:  Alert and Appropriate  Insight:  Good  Engagement in Group:  Engaged  Modes of Intervention:  Activity and Discussion  Summary of Progress/Problems: In this group, the RN reviewed healthy coping strategies. The patients were taught why they struggle with symptoms of depression and how to work through them. The patient was cooperative and participated.  Kirstie Mirza 09/16/2018, 6:22 PM

## 2018-09-16 NOTE — Progress Notes (Signed)
  [facility] Adult Case Management Discharge Plan :  Will you be returning to the same living situation after discharge:  Yes,  parents' home temporarily until he can get back into an [employer] At discharge, do you have transportation home?: Yes,  mother Do you have the ability to pay for your medications: Yes,  income and private insurance  Release of information consent forms completed and turned in to Medical Records by CSW.   Patient to Follow up at: Follow-up Information    [facility] Follow up.   Specialty:  Behavioral Health Why:  Please attend your intake appointment on Monday 09/17/2018 at 1:00pm. Please bring photo ID, insurance information, and hospital discharge paperwork. Contact information: [address] [place] [address] [phone]           Next level of care provider has access to [facility] Link:no  Safety Planning and Suicide Prevention discussed: Yes,  with mother  Have you used any form of tobacco in the last 30 days? (Cigarettes, Smokeless Tobacco, Cigars, and/or Pipes): Yes  Has patient been referred to the Quitline?: Patient refused referral  Patient has been referred for addiction treatment: Yes  Lynnell Chad, LCSW 09/16/2018, 9:40 AM

## 2018-09-16 NOTE — BHH Suicide Risk Assessment (Signed)
[facility] Discharge Suicide Risk Assessment   Principal Problem: Opioid dependence in remission [facility]) Discharge Diagnoses: Principal Problem:   Opioid dependence in remission [facility]) Active Problems:   Opiate dependence (HCC)   MDD (major depressive disorder), recurrent episode, severe (HCC)  Patient is alert oriented and cooperative eager for discharge but denies cravings no tremors no withdrawal symptoms no thoughts of harming self or others no mania again very stable in mood and affect insistent upon early discharge which is fine as paperwork is done at this point Total Time spent with patient: 45 minutes Mental Status Per Nursing Assessment::   On Admission:  Self-harm thoughts, Suicidal ideation indicated by patient  Demographic Factors:  Male and Caucasian  Loss Factors: NA  Historical Factors: NA  Risk Reduction Factors:   Religious beliefs about death and Employed  Continued Clinical Symptoms:  Alcohol/Substance Abuse/Dependencies  Cognitive Features That Contribute To Risk:  None    Suicide Risk:  Minimal: No identifiable suicidal ideation.  Patients presenting with no risk factors but with morbid ruminations; may be classified as minimal risk based on the severity of the depressive symptoms  Follow-up Information    [facility] Follow up.   Specialty:  Behavioral Health Why:  Please attend your intake appointment on Monday 09/17/2018 at 1:00pm. Please bring photo ID, insurance information, and hospital discharge paperwork. Contact information: [address] [place] [address] [phone]           Plan Of Care/Follow-up recommendations:  Activity:  full  Anvith Mauriello, MD 09/16/2018, 7:45 AM

## 2018-12-12 ENCOUNTER — Telehealth (HOSPITAL_COMMUNITY): Payer: Self-pay | Admitting: Psychology

## 2018-12-14 ENCOUNTER — Ambulatory Visit (HOSPITAL_COMMUNITY): Payer: BLUE CROSS/BLUE SHIELD | Admitting: Psychology

## 2018-12-19 ENCOUNTER — Telehealth (HOSPITAL_COMMUNITY): Payer: Self-pay | Admitting: Psychology

## 2018-12-19 ENCOUNTER — Ambulatory Visit (HOSPITAL_COMMUNITY): Payer: BLUE CROSS/BLUE SHIELD | Admitting: Psychology

## 2018-12-24 ENCOUNTER — Ambulatory Visit (HOSPITAL_COMMUNITY): Payer: BC Managed Care – PPO | Admitting: Psychology

## 2018-12-24 ENCOUNTER — Other Ambulatory Visit: Payer: Self-pay

## 2018-12-24 ENCOUNTER — Telehealth (HOSPITAL_COMMUNITY): Payer: Self-pay | Admitting: Psychology

## 2018-12-24 DIAGNOSIS — F1120 Opioid dependence, uncomplicated: Secondary | ICD-10-CM

## 2018-12-26 ENCOUNTER — Telehealth (HOSPITAL_COMMUNITY): Payer: Self-pay | Admitting: Psychology

## 2018-12-27 ENCOUNTER — Encounter (HOSPITAL_COMMUNITY): Payer: Self-pay | Admitting: Psychology

## 2018-12-27 ENCOUNTER — Other Ambulatory Visit (HOSPITAL_COMMUNITY): Payer: BC Managed Care – PPO | Attending: Psychiatry | Admitting: Licensed Clinical Social Worker

## 2018-12-27 ENCOUNTER — Other Ambulatory Visit: Payer: Self-pay

## 2018-12-27 DIAGNOSIS — Z79899 Other long term (current) drug therapy: Secondary | ICD-10-CM | POA: Diagnosis not present

## 2018-12-27 DIAGNOSIS — F4310 Post-traumatic stress disorder, unspecified: Secondary | ICD-10-CM | POA: Diagnosis present

## 2018-12-27 DIAGNOSIS — Z87891 Personal history of nicotine dependence: Secondary | ICD-10-CM | POA: Insufficient documentation

## 2018-12-27 DIAGNOSIS — F1120 Opioid dependence, uncomplicated: Secondary | ICD-10-CM | POA: Insufficient documentation

## 2018-12-27 DIAGNOSIS — F341 Dysthymic disorder: Secondary | ICD-10-CM | POA: Diagnosis not present

## 2018-12-27 DIAGNOSIS — F1994 Other psychoactive substance use, unspecified with psychoactive substance-induced mood disorder: Secondary | ICD-10-CM | POA: Insufficient documentation

## 2018-12-27 DIAGNOSIS — F1290 Cannabis use, unspecified, uncomplicated: Secondary | ICD-10-CM | POA: Diagnosis not present

## 2018-12-27 DIAGNOSIS — R1110 Vomiting, unspecified: Secondary | ICD-10-CM | POA: Diagnosis not present

## 2018-12-27 DIAGNOSIS — Z9119 Patient's noncompliance with other medical treatment and regimen: Secondary | ICD-10-CM | POA: Insufficient documentation

## 2018-12-27 DIAGNOSIS — F1410 Cocaine abuse, uncomplicated: Secondary | ICD-10-CM | POA: Insufficient documentation

## 2018-12-27 DIAGNOSIS — F1020 Alcohol dependence, uncomplicated: Secondary | ICD-10-CM | POA: Insufficient documentation

## 2018-12-27 DIAGNOSIS — K219 Gastro-esophageal reflux disease without esophagitis: Secondary | ICD-10-CM | POA: Diagnosis not present
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
## 2018-12-31 ENCOUNTER — Encounter (HOSPITAL_COMMUNITY): Payer: BC Managed Care – PPO

## 2018-12-31 ENCOUNTER — Telehealth (HOSPITAL_COMMUNITY): Payer: Self-pay | Admitting: Psychology

## 2018-12-31 NOTE — Progress Notes (Signed)
    Daily Group Progress Note  Program: CD-IOP   Group Time: 1pm-2:30pm  Participation Level: Active  Behavioral Response: Sharing, Monopolizing and Motivated  Type of Therapy: Process Group  Topic:  1pm-2:30pm Clinician checked in with group members, assessing for SI/HI/psychosis and overall level of functioning, including sobriety date and number of community meetings attended since the previous group session. Clinician allowed time for group members to discuss recent events or stressors and their effect on recovery. Clinician and group members reviewed previously discussed family rules discussed the previous day. Clinician facilitated progressive muscle relaxation as mindfulness exercise.   Group Time: 2:30pm-4pm  Participation Level: Active  Behavioral Response: Sharing, Monopolizing and Motivated  Type of Therapy: Psycho-education Group  Topic: Clinician provided information on Family Roles in families with addiction. Clinician provided psycho-educational information related to Adult Children of Alcoholics. Clinician facilitated discussion on common behaviors in work and interpersonal relationships.   Summary: Client presents for first day of CDIOP after initial assessment. Client shares treatment history and current motivation for this level of care following residential treatment. Client shares wanting to act 'like and adult' and maintain sobriety this time. Client shares having difficulty processing in group in front of other males however was able to share facts about family dynamics growing up and how that has effected his personal relationships and interactions with peers and at work. Client states he plans to go fishing over the weekend and will maintain sobriety by not bringing drugs or alcohol onto the boat.    Family Program: Family present? No   Name of family member(s): N/A  UDS collected: No; requested but not completed.  AA/NA attended?: Yes; reports daily  attendance in person or online  Sponsor?: No   Pierce Barocio A Katreena Schupp, LCSW, LCAS

## 2018-12-31 NOTE — Progress Notes (Signed)
Patient ID: Craig Howe, male   DOB: Jan 13, 1991, 28 y.o.   MRN: [id number] CD-IOP: The patient appeared for his first group session today. He did not provide a urine specimen as requested. It is unclear whether he just forgot or avoided it. We will discuss the importance of providing drug tests when requested.

## 2019-01-01 ENCOUNTER — Telehealth (HOSPITAL_COMMUNITY): Payer: Self-pay | Admitting: Psychology

## 2019-01-02 ENCOUNTER — Other Ambulatory Visit: Payer: Self-pay

## 2019-01-02 ENCOUNTER — Encounter (HOSPITAL_COMMUNITY): Payer: Self-pay | Admitting: Medical

## 2019-01-02 ENCOUNTER — Other Ambulatory Visit (INDEPENDENT_AMBULATORY_CARE_PROVIDER_SITE_OTHER): Payer: BC Managed Care – PPO | Admitting: Licensed Clinical Social Worker

## 2019-01-02 ENCOUNTER — Encounter (HOSPITAL_COMMUNITY): Payer: BC Managed Care – PPO

## 2019-01-02 VITALS — BP 126/74 | HR 74 | Temp 98.8°F | Resp 18 | Ht 75.0 in | Wt 206.6 lb

## 2019-01-02 DIAGNOSIS — F341 Dysthymic disorder: Secondary | ICD-10-CM | POA: Diagnosis not present

## 2019-01-02 DIAGNOSIS — F419 Anxiety disorder, unspecified: Secondary | ICD-10-CM | POA: Diagnosis not present

## 2019-01-02 DIAGNOSIS — F1120 Opioid dependence, uncomplicated: Secondary | ICD-10-CM

## 2019-01-02 DIAGNOSIS — F1910 Other psychoactive substance abuse, uncomplicated: Secondary | ICD-10-CM

## 2019-01-02 DIAGNOSIS — F1994 Other psychoactive substance use, unspecified with psychoactive substance-induced mood disorder: Secondary | ICD-10-CM

## 2019-01-02 DIAGNOSIS — Z811 Family history of alcohol abuse and dependence: Secondary | ICD-10-CM

## 2019-01-02 DIAGNOSIS — Z6372 Alcoholism and drug addiction in family: Secondary | ICD-10-CM

## 2019-01-02 DIAGNOSIS — B360 Pityriasis versicolor: Secondary | ICD-10-CM

## 2019-01-02 DIAGNOSIS — F4310 Post-traumatic stress disorder, unspecified: Secondary | ICD-10-CM

## 2019-01-02 DIAGNOSIS — Z8659 Personal history of other mental and behavioral disorders: Secondary | ICD-10-CM

## 2019-01-02 DIAGNOSIS — F17220 Nicotine dependence, chewing tobacco, uncomplicated: Secondary | ICD-10-CM

## 2019-01-02 DIAGNOSIS — K219 Gastro-esophageal reflux disease without esophagitis: Secondary | ICD-10-CM

## 2019-01-02 MED ORDER — QUETIAPINE FUMARATE 25 MG PO TABS: 25.0000 mg | ORAL_TABLET | ORAL | 0 refills | Status: DC

## 2019-01-02 MED ORDER — ESCITALOPRAM OXALATE 20 MG PO TABS: 20.0000 mg | ORAL_TABLET | Freq: Every day | ORAL | 0 refills | Status: DC

## 2019-01-02 MED ORDER — QUETIAPINE FUMARATE 50 MG PO TABS: 50.0000 mg | ORAL_TABLET | Freq: Every day | ORAL | 0 refills | Status: DC

## 2019-01-02 MED ORDER — HYDROXYZINE PAMOATE 25 MG PO CAPS: 25.0000 mg | ORAL_CAPSULE | Freq: Four times a day (QID) | ORAL | 0 refills | Status: DC | PRN
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
## 2019-01-03 ENCOUNTER — Other Ambulatory Visit (INDEPENDENT_AMBULATORY_CARE_PROVIDER_SITE_OTHER): Payer: BC Managed Care – PPO | Admitting: Licensed Clinical Social Worker

## 2019-01-03 ENCOUNTER — Other Ambulatory Visit: Payer: Self-pay

## 2019-01-03 DIAGNOSIS — F1120 Opioid dependence, uncomplicated: Secondary | ICD-10-CM

## 2019-01-03 DIAGNOSIS — F4310 Post-traumatic stress disorder, unspecified: Secondary | ICD-10-CM | POA: Diagnosis not present

## 2019-01-03 NOTE — Progress Notes (Signed)
    Daily Group Progress Note  Program: CD-IOP   Group Time: 1pm-2:30pm  Participation Level: Active  Behavioral Response: Sharing, Rationalizing and Minimizing  Type of Therapy: Process Group  Topic: The purpose of process group is to address recent successes and barriers to recovery and sobriety. Clinician and group members completed and processed Self-Assessment and discussed goals for areas needing progress.    Group Time: 2:30pm-4pm  Participation Level: None  Behavioral Response: N/A  Type of Therapy: Psycho-education Group  Topic: The purpose of the psycho-educational group is to provide information of the Cycle of Change. Group members reviewed Assessing Stage of Change Worksheet and discussed how current behaviors support or contradict identified behaviors for each stage of change.   Summary: Client left group early after meeting with PA-C. Client states he had an EMDR session scheduled and due to missing several previously he did not re-schedule the one today which was in the middle of group. Client engaged in some time in the process group prior to leaving. Client did not attend the psycho-educational group at all. Client reports sobriety date from alcohol is 4/27 and has attended 3 meetings since last group however client also acknowledged that he smoked marijuana within the past 2 weeks and that when he spoke to [place], he had a drink since leaving treatment, making his sobriety date from all substances 12/12/18. Client reports it was not his initial goal to remain abstinent from all substances, only ones he was previously addicted to. Client did not complete drug screen for the second time and will be considered a positive UDS. Client engaged in conversation passive aggressively stating he had discussed many of the topics in previous treatment and did not respond well when challenged by group leader that even though previously discussed, he did relapse and this is a new  treatment setting. Client reports he wanted to make life changes 'this time around' including piercing his ears.   Family Program: Family present? No   Name of family member(s): N/A  UDS collected: No Results: has not completed UDS within first 2 groups attended  AA/NA attended?: Yes 3  Sponsor?: Yes   Octavie Westerhold A Sims Laday, LCSW ,LCAS

## 2019-01-04 ENCOUNTER — Telehealth (HOSPITAL_COMMUNITY): Payer: Self-pay | Admitting: Licensed Clinical Social Worker

## 2019-01-05 ENCOUNTER — Telehealth (HOSPITAL_COMMUNITY): Payer: Self-pay | Admitting: Medical

## 2019-01-05 MED ORDER — NALTREXONE HCL 50 MG PO TABS: 50.0000 mg | ORAL_TABLET | Freq: Every day | ORAL | 0 refills | Status: AC

## 2019-01-05 NOTE — Progress Notes (Signed)
Patient ID: Craig Howe, male   DOB: 1990-12-26, 28 y.o.   MRN: [id number] Phoned patient to FU on his Vivitol injection. Today is 31st day since last injection. He did not get current order .Says he was told he would get it Monday.Agreed he should have oral supply.Will phone in

## 2019-01-06 ENCOUNTER — Encounter (HOSPITAL_COMMUNITY): Payer: Self-pay | Admitting: Medical

## 2019-01-07 ENCOUNTER — Telehealth (HOSPITAL_COMMUNITY): Payer: Self-pay | Admitting: Psychiatry

## 2019-01-07 ENCOUNTER — Telehealth (HOSPITAL_COMMUNITY): Payer: Self-pay | Admitting: Licensed Clinical Social Worker

## 2019-01-07 ENCOUNTER — Other Ambulatory Visit (HOSPITAL_COMMUNITY): Payer: Self-pay | Admitting: Medical

## 2019-01-07 ENCOUNTER — Encounter (HOSPITAL_COMMUNITY): Payer: BC Managed Care – PPO

## 2019-01-07 NOTE — Progress Notes (Signed)
    Daily Group Progress Note  Program: CD-IOP   Group Time: 1pm-2:30pm  Participation Level: Active  Behavioral Response: Appropriate  Type of Therapy: Process Group  Topic: The purpose of the process group this day is utilizing MI and CBT to discuss relationship dynamics in recovery. Clinician and group members processed decisions made when in active addiction which often do not like up with our values. Clinician and group members processed feelings of guilt related to another person's relapse/sobriety. Clinician presented mindfulness activity for managing uncomfortable feelings.    Group Time: 2:30pm-4pm  Participation Level: Active  Behavioral Response: Appropriate  Type of Therapy: Psycho-education Group  Topic: Clinician presented the topic of assertive communication skills. Clinician and group members reviewed passive, aggressive, and assertive communication styles. Clinician presented clients with DBT Interpersonal effectiveness skills as well as 5 Finger Communication, focused on utilizing I-statements in conversations. Clinician and group members role played using assertive communication skills in daily life and as Howe part of relapse prevention and saying no to unhealthy behaviors. Clinician presented common sets of coping skills and the pros and cons of each. Clinician requested group members attempt at least one skill before the next group.     Summary: Client completes UDS positive for opiates and cannabis. Client reports he attempted to buy xanax for his anxiety but was later told it was fentynal. Client reports not wanting to discuss his relapse in group due to not wanting to be Howe 'debbie downer.' Client reports argument with his girlfriend from the previous night resulting in increased cravings and thoughts of suicide. Client denies current SI but endorses passive SI throughout the day. Client request to leave group early to pick up prescriptions at home pharmacy. Client  notes he gave the task to someone else yesterday who did not complete it and states accepting responsibility for not doing this on his own. Clinician attempted to develop discrepancies with client about reported desire to 'do it different this time' and lack of changed behavior. Client acknowledged being responsible for his own recovery and not others but also identifies he will feel guilty if his girlfriend relapses after she said they would use together. Client reports the possibility of having to move out and sleep in his car to avoid further confrontation with roommates. Client was able to role play utilization of I-statements and declining offer for continued use in session. Client states he plans to attend meetings and speak with his sponsor over the weekend "If I need to."   Family Program: Family present? No   Name of family member(s): N/Howe  UDS collected: Yes Results: cannabis and opioids  AA/NA attended?: Yes  Sponsor?: Yes   Craig Howe Shakia Sebastiano, LCSW , LCAS

## 2019-01-07 NOTE — Progress Notes (Signed)
Patient ID: Craig Howe, male   DOB: 04-05-1991, 28 y.o.   MRN: [id number]  Patient failed to come to Group due to witness in [place] [facility] on way into CD IOP. Was scheduled for VIVITROL Injection but there has been a delay in Pensions consultant per Reagan.CNA.Pt was prescribed oral Naltrexone Saturday and advised of same to cover lapse in Vivitrol injections.

## 2019-01-09 ENCOUNTER — Other Ambulatory Visit (INDEPENDENT_AMBULATORY_CARE_PROVIDER_SITE_OTHER): Payer: BC Managed Care – PPO | Admitting: Licensed Clinical Social Worker

## 2019-01-09 ENCOUNTER — Other Ambulatory Visit: Payer: Self-pay

## 2019-01-09 DIAGNOSIS — F17220 Nicotine dependence, chewing tobacco, uncomplicated: Secondary | ICD-10-CM

## 2019-01-09 DIAGNOSIS — Z8659 Personal history of other mental and behavioral disorders: Secondary | ICD-10-CM

## 2019-01-09 DIAGNOSIS — F1994 Other psychoactive substance use, unspecified with psychoactive substance-induced mood disorder: Secondary | ICD-10-CM

## 2019-01-09 DIAGNOSIS — F419 Anxiety disorder, unspecified: Secondary | ICD-10-CM

## 2019-01-09 DIAGNOSIS — Z811 Family history of alcohol abuse and dependence: Secondary | ICD-10-CM

## 2019-01-09 DIAGNOSIS — K254 Chronic or unspecified gastric ulcer with hemorrhage: Secondary | ICD-10-CM

## 2019-01-09 DIAGNOSIS — F1910 Other psychoactive substance abuse, uncomplicated: Secondary | ICD-10-CM

## 2019-01-09 DIAGNOSIS — F4310 Post-traumatic stress disorder, unspecified: Secondary | ICD-10-CM

## 2019-01-09 DIAGNOSIS — F341 Dysthymic disorder: Secondary | ICD-10-CM

## 2019-01-09 DIAGNOSIS — F1120 Opioid dependence, uncomplicated: Secondary | ICD-10-CM

## 2019-01-09 DIAGNOSIS — Z6372 Alcoholism and drug addiction in family: Secondary | ICD-10-CM

## 2019-01-09 MED ORDER — BUPROPION HCL ER (SR) 150 MG PO TB12: 150.0000 mg | ORAL_TABLET | Freq: Two times a day (BID) | ORAL | 2 refills | Status: DC

## 2019-01-09 MED ORDER — OMEPRAZOLE-SODIUM BICARBONATE 40-1680 MG PO PACK: PACK | ORAL | 0 refills | Status: DC

## 2019-01-09 NOTE — Progress Notes (Signed)
[facility] Follow-up Outpatient CDIOP Date: 01/09/19  Admission Date:12/27/2018  Sobriety date:01/08/19-reports took Percocet yesterday  Subjective: " I know"  HPI : Pt is seen for Initial CD IOP in treatment Provider FU Pt is confronted with fact he has not attended a complete group since starting treatment either because he arrives late,leaves early or both. He is overly apologetic and always very polite. He makes excuses for his behavior but is unable to state the cause(s).  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 He also admits he used a PERCOCET YESTERDAY WHILE BECOMING EMOTIONAL HELPING SO OF FRIEND WHO COMMITTED SUICIDE NIGHT BEFORE CLEAN HOUSE.ADMITS HE SHOULD NOT HAVE GONE ALONE.SAYS HE TRIED TO VOMIT PILL UP. Insurance has delayed his repeat vivitrol injection -now scheduled for next Monday .Claims he has been taking meds including oral Naltrexone after "finally" picking them up.  Also requesting new PCP-doesnt feel listened to by current provider.  Review of Systems: Psychiatric: Agitation: complaining of anxiety-GAD 7 score is 9 on 6/11 in mild range/he is impulsive/disorgnized Hallucination: No Depressed Mood: PHQ 9 score is Dysthymic (9) consistent with his CPTSD Insomnia: Denies Hypersomnia: No Altered Concentration:He record 1 on screen but his behaviors indicate daily problems with this Feels Worthless: Records 1 Grandiose Ideas: Not openly/consciously Belief In Special Powers: No New/Increased Substance Abuse: Continues to use episodically Compulsions: ongoing use -says he is now taking medications  Neurologic: Headache: No Seizure: No Paresthesias: No  Current Medications: buPROPion 150 MG 12 hr tablet Commonly known as: Wellbutrin SR Take 1 tablet (150 mg total) by mouth 2 (two) times daily.   folic acid 1 MG tablet Commonly known as: FOLVITE   hydrOXYzine 25 MG capsule Commonly known as: VISTARIL Take 1 capsule (25 mg total) by mouth every 6 (six) hours as needed for up to 270 doses for anxiety.  naltrexone 50 MG tablet Commonly known as: DEPADE Take 1 tablet (50 mg total) by mouth daily for 30 days. Take as needed until Injection of vivitrol  Omeprazole-Sodium Bicarbonate 40-1680 MG Pack Take 1 dose now and 1 dose in 8 hrs then 1 dose daily for 4 weeks  * QUEtiapine 50 MG tablet Commonly known as: SEROQUEL Take 1 tablet (50 mg total) by mouth at bedtime.  * QUEtiapine 25 MG tablet Commonly known as: SEROQUEL Take 1 tablet (25 mg total) by mouth every morning for 90 doses.  Vivitrol 380 MG Susr Generic drug: Naltrexone     Mental Status Examination  Appearance: Alert: Yes Attention: good  Cooperative: Yes Eye Contact: Good Speech: Clear and coherent Psychomotor Activity: Normal Memory/Concentration: Normal/intact Oriented: person, place, time/date and situation Mood: Euthymic Affect: Appropriate and Congruent Thought Processes and Associations: Coherent and Intact Fund of Knowledge: Good Thought Content: WDL Insight: Good Judgement: Good  UDS:+ Pot /Opiates  Diagnosis:  0 Opioid use disorder, severe, dependence (HCC) 0 PTSD (post-traumatic stress disorder) 0 Dysthymia 0 Chronic anxiety 0 History of ADHD 0 Polysubstance abuse (HCC) 0 Substance induced mood disorder (HCC) 0 Biological father as perpetrator of maltreatment and neglect 0 Family history of alcoholism in paternal grandfather 0 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   01/10/19 ADDENDUM: Spoke with Counselor today VZ:DGLOVFIre:patient status-she reports that pt tried to give her a candy bar which she refused.For the rest of the Group he c/o her treating him meanly and if he relapsed this weekend it would be "her fault"!!!??? Seems at this point that he has a significant Borderline Personality vs Characteristics of Adult Child of an Alcoholic or both ? Discussed complexity of his diagnostics but regardless for now the approach needs to be behavioral-will have counselors draw up behavioral contract      Craig Mornharles Johnjoseph Rolfe, PA-CPatient ID: Craig Howe, Craig Howe   DOB: [birth year], 28 y.o.   MRN: [id number]

## 2019-01-10 ENCOUNTER — Encounter (HOSPITAL_COMMUNITY): Payer: Self-pay | Admitting: Medical

## 2019-01-10 ENCOUNTER — Other Ambulatory Visit: Payer: Self-pay

## 2019-01-10 ENCOUNTER — Other Ambulatory Visit (HOSPITAL_COMMUNITY): Payer: BC Managed Care – PPO | Admitting: Licensed Clinical Social Worker

## 2019-01-10 ENCOUNTER — Other Ambulatory Visit (HOSPITAL_COMMUNITY): Payer: Self-pay | Admitting: Medical

## 2019-01-10 DIAGNOSIS — F4310 Post-traumatic stress disorder, unspecified: Secondary | ICD-10-CM | POA: Diagnosis not present

## 2019-01-10 DIAGNOSIS — F1120 Opioid dependence, uncomplicated: Secondary | ICD-10-CM

## 2019-01-14 ENCOUNTER — Other Ambulatory Visit (HOSPITAL_COMMUNITY): Payer: BC Managed Care – PPO | Admitting: Licensed Clinical Social Worker

## 2019-01-14 ENCOUNTER — Other Ambulatory Visit: Payer: Self-pay

## 2019-01-14 DIAGNOSIS — F1120 Opioid dependence, uncomplicated: Secondary | ICD-10-CM

## 2019-01-14 DIAGNOSIS — F4310 Post-traumatic stress disorder, unspecified: Secondary | ICD-10-CM | POA: Diagnosis not present

## 2019-01-14 DIAGNOSIS — F1994 Other psychoactive substance use, unspecified with psychoactive substance-induced mood disorder: Secondary | ICD-10-CM

## 2019-01-14 DIAGNOSIS — F1910 Other psychoactive substance abuse, uncomplicated: Secondary | ICD-10-CM

## 2019-01-14 NOTE — Progress Notes (Signed)
    Daily Group Progress Note  Program: CD-IOP   Group Time: 1pm-2:30pm  Participation Level: Active  Behavioral Response: Sharing, Rationalizing, Disruptive and Minimizing  Type of Therapy: Process Group  Topic: Clinician checked in with clients, assessing for SI/HI/psychosis and substance use. Clinician allowed time for group members to process recent stressors and effect on recovery. Clinician presented the topic of Guilt vs Shame. Clinician and group members processed the differences and the use of self-compassion as a skill to address shame. Clinician and group members processed underlying feelings, including guilt and shame, which can present as anger. Group members processed how expressing anger was learned, and identified common triggers. Clinician and group members processed how active substance use effected their anger response.   Group Time: 2:30pm-4pm  Participation Level: Active  Behavioral Response: Rationalizing, Resistant and Minimizing  Type of Therapy: Psycho-education Group  Topic: Clinician facilitated mindfulness meditation. Clinician presented the psycho-educational topic of Cognitive Distortions. Clinician reviewed cognitive triangle and connection of thoughts on feelings and behaviors. Clinician and group members reviewed ways to challenge cognitive distortions.    Summary: Client presents reporting relapse the previous night with suicidal thoughts after learning about the death of his sponsor. Client identified he tried to throw up the pill but it was a mistake to take the pill in the first place. See PA note for discussion about lack of consistent attendance to full group sessions. Client identified he continues to use lying and blaming to get what he wants and agree that he uses this as an excuse to continue using. Client verbalizes some shame related to substance abuse and the effects on his role in the family however does not identify any changed behaviors  since being discharged from last inpatient program. Client presented distracted the majority of the time with tangential speech, cutting others off, and challenging group members and clinician. Client had difficulty sitting still and was in and out of the group room frequently. Client reports he considered substance use prior to attending group but was able to avoid use. Sobriety date 01/09/19, 1 meeting attended since last group.   Family Program: Family present? No   Name of family member(s): NA  UDS collected: Yes Results: UNK; last UDS on 6/18 positive for opioids and cannabis AA/NA attended?: Yes  Sponsor?: Yes   Caelin Rosen A Zakira Ressel, LCSW , LCAS

## 2019-01-15 NOTE — Progress Notes (Signed)
    Daily Group Progress Note  Program: CD-IOP   Group Time: 1pm-2:30pm  Participation Level: Active  Behavioral Response: Sharing, Passive-Aggressive, Blaming and Intrusive  Type of Therapy: Process Group  Topic: Clinician met with clients, assessing for SI/HI/psychosis and overall level of functioning. Clinician and group members processed reasons for engaging in services and goals for treatment. Group members processed motivation for treatment. Clinician and group members challenged levels of motivation in group members. Clinician utilized motivational interviewing to develop discrepancies in client verbal responses and recent actions. Clinician challenged group members to be mindful of cognitive distortions recently reviewed and accepting responsibility for own behaviors and recovery. Clinician and group members completed cognitive distortion list and how to identify these to address ruminating thoughts. Clinician provided clients with handout of STOPP skill to assist in identifying distorted thinking and allow opportunity to create alternative, realistic thought.    Group Time: 2:30pm-4pm  Participation Level: Active  Behavioral Response: Rationalizing, Passive-Aggressive, Resistant, Attention-Seeking and Minimizing  Type of Therapy: Psycho-education Group  Topic: Group members discussed reasons to utilize [employer] and options for online or face to face meetings. Clinician presented the Distress Tolerance Skill of 'Letting Go of Emotional Suffering: Mindfulness of Your Current Emotion." Clinician and group members reviewed identifying and observing emotions non judgmentally, and accepting that emotions are not permanent. Clinician presented sensory options for physical response to stress or cravings as well as additional options to add into daily life for long term changes. Clinician encouraged clients to create a self-affirmation. Clinician inquired about plans to self-care over  the weekend and activities planned to support recovery.    Summary: Client presented agitated and distracted. Client verbalized anger related to clinician not accepting candy bar and blamed clinician for future relapse possibility. Client challenged other group member related to motivation however was very defensive when challenged on his level of motivation vs displayed behaviors. Client was reminded of cognitive distortions he was utilizing in group as excuses for continued unhealthy behaviors. Client reports recent use due to ongoing frustration. Client reports going to a meeting and talking with a sponsor.   Family Program: Family present? No     UDS collected: No Results: previous UDS positive for opioids and marijuana  AA/NA attended?: Yes 95mg  Sponsor?: Yes   Landen Knoedler A Giovanni Bath, LCSW, LCAS

## 2019-01-16 ENCOUNTER — Other Ambulatory Visit (HOSPITAL_COMMUNITY): Payer: BC Managed Care – PPO

## 2019-01-16 ENCOUNTER — Encounter (HOSPITAL_COMMUNITY): Payer: Self-pay

## 2019-01-16 ENCOUNTER — Other Ambulatory Visit: Payer: Self-pay

## 2019-01-16 NOTE — Progress Notes (Signed)
    Daily Group Progress Note  Program: CD-IOP   Group Time: 1pm-2:30pm  Participation Level: Active  Behavioral Response: Appropriate  Type of Therapy: Process Group  Topic: Clinician checked in with clinician, assessing for SI/HI/psychosis, overall level of functioning and substance use. Clinician facilitated processing related to weekend stressors and their effect on recovery. Clinician inquired about skills attempted over the weekend. Clinician reviewed skills from previous week.   Group Time: 2:30pm-4pm  Participation Level: Active  Behavioral Response: Minimizing  Type of Therapy: Psycho-education Group  Topic: Clinician presented the topic of resentment and forgiveness. Clinician and group members discussed situations leading to resentment, additional feelings, thoughts, and behaviors related to resentment. Clinician and group members identified core believes and processed how a violation of these by self or others can lead to guilt or resentment. Clinician and group members processed violating boundaries is more likely when actively using substances.    Summary: Client presented to group, reporting last use Saturday. Client states he attended the funeral and is on speaking terms with his girlfriend. Client is interested in engaging with an [employer] to help with accountability outside of group. Client remains defensive when challenged about distorted thoughts and rationalizing behaviors. Client expressed remorse for behavior in group last week and continued use, requesting he not be discharged from the program. Client identified resentment toward self and allowing addiction to continue. Client also voiced resentment toward people who can use substances 'successfully' and not become addicted. Client notifies clinician he has discontinued online EMDR therapy for the time.    Family Program: Family present? No   Name of family member(s): NA  UDS collected: Yes Results:  pending  AA/NA attended?: Yes 2  Sponsor?: Yes   Issaih Kaus A Lamaria Hildebrandt, LCSW, LCAS

## 2019-01-17 ENCOUNTER — Ambulatory Visit (INDEPENDENT_AMBULATORY_CARE_PROVIDER_SITE_OTHER): Payer: BC Managed Care – PPO

## 2019-01-17 ENCOUNTER — Other Ambulatory Visit: Payer: Self-pay

## 2019-01-17 ENCOUNTER — Other Ambulatory Visit (HOSPITAL_COMMUNITY): Payer: BC Managed Care – PPO | Attending: Psychiatry | Admitting: Licensed Clinical Social Worker

## 2019-01-17 DIAGNOSIS — F341 Dysthymic disorder: Secondary | ICD-10-CM | POA: Insufficient documentation

## 2019-01-17 DIAGNOSIS — Z79899 Other long term (current) drug therapy: Secondary | ICD-10-CM | POA: Diagnosis not present

## 2019-01-17 DIAGNOSIS — F1120 Opioid dependence, uncomplicated: Secondary | ICD-10-CM | POA: Diagnosis not present

## 2019-01-17 DIAGNOSIS — Z9119 Patient's noncompliance with other medical treatment and regimen: Secondary | ICD-10-CM | POA: Diagnosis not present

## 2019-01-17 DIAGNOSIS — B360 Pityriasis versicolor: Secondary | ICD-10-CM | POA: Insufficient documentation

## 2019-01-17 DIAGNOSIS — K219 Gastro-esophageal reflux disease without esophagitis: Secondary | ICD-10-CM | POA: Diagnosis not present

## 2019-01-17 DIAGNOSIS — F909 Attention-deficit hyperactivity disorder, unspecified type: Secondary | ICD-10-CM | POA: Diagnosis not present

## 2019-01-17 DIAGNOSIS — K254 Chronic or unspecified gastric ulcer with hemorrhage: Secondary | ICD-10-CM | POA: Diagnosis not present

## 2019-01-17 DIAGNOSIS — F4310 Post-traumatic stress disorder, unspecified: Secondary | ICD-10-CM | POA: Diagnosis not present

## 2019-01-17 DIAGNOSIS — F1124 Opioid dependence with opioid-induced mood disorder: Secondary | ICD-10-CM | POA: Insufficient documentation

## 2019-01-17 DIAGNOSIS — F17220 Nicotine dependence, chewing tobacco, uncomplicated: Secondary | ICD-10-CM | POA: Diagnosis not present

## 2019-01-17 DIAGNOSIS — F1910 Other psychoactive substance abuse, uncomplicated: Secondary | ICD-10-CM

## 2019-01-17 DIAGNOSIS — F1121 Opioid dependence, in remission: Secondary | ICD-10-CM | POA: Diagnosis not present

## 2019-01-17 MED ORDER — NALTREXONE 380 MG IM SUSR
380.0000 mg | INTRAMUSCULAR | Status: AC
  Administered 2019-01-17: 380 mg via INTRAMUSCULAR

## 2019-01-17 NOTE — Progress Notes (Signed)
Patient came in today ready for his Vivitrol injection. Patient states that his last day of use was 6/27. The injection of Vivitrol 380 mg was prepared as ordered and administered in patients left ventrogluteal. Patient tolerated well and without complaint. Patient will return in one month for his next injection

## 2019-01-21 ENCOUNTER — Other Ambulatory Visit (HOSPITAL_COMMUNITY): Payer: BC Managed Care – PPO | Admitting: Licensed Clinical Social Worker

## 2019-01-21 ENCOUNTER — Encounter: Payer: Self-pay | Admitting: Medical

## 2019-01-21 ENCOUNTER — Other Ambulatory Visit: Payer: Self-pay

## 2019-01-21 DIAGNOSIS — F1124 Opioid dependence with opioid-induced mood disorder: Secondary | ICD-10-CM | POA: Diagnosis not present

## 2019-01-21 DIAGNOSIS — F1120 Opioid dependence, uncomplicated: Secondary | ICD-10-CM

## 2019-01-21 NOTE — Progress Notes (Signed)
    Daily Group Progress Note  Program: CD-IOP   Group Time: 1pm-2:30pm  Participation Level: Active  Behavioral Response: Appropriate  Type of Therapy: Process Group  Topic: Clinician checked in with clients, assessing for SI/HI/psychosis and overall level of functioning including intensityof cravings and relapse. Clinician and group members processed recent stressors and effects on recovery. Clinician and group members processed uncomfortable or difficult  common feelings and thoughts common to cope with in early recovery. Clinician and group members reviewed HALT BS, difficult feelings often in early recovery and how to create alternate behaviors.   Group Time: 2:30pm-4pm  Participation Level: Active  Behavioral Response: Rationalizing and Attention-Seeking  Type of Therapy: Psycho-education Group  Topic: Clinician and group members create Relapse Prevention Plan, focusing on identified triggers and common behaviors related to holiday weekends. Clinician provided supporttive feedback, challenging clients passive approach to recovery rather than active planning. Clinician praised groups creative solutions for common struggles over holidays.   Summary: Client did receive vivitrol shot after protest and UDS negative for opioids. Client reports concern he will get sick over the weekend due to getting the shot but does not want to be dismissed from the program. Client acknowledges phrases and behaviors currently using which he previously used to get his way and continue substance abuse. Client asks, despite signed behavior chart, if he can plan on missing a group session for a family trip. Client provided plans for the weekend including being around family to help avoid temptation.   Family Program: Family present? No   Name of family member(s): NA  UDS collected: No Results: Quick dip: marijuana only  AA/NA attended?: Yes  Sponsor?: Yes   Karissa A Brone, LCSW,  LCAS

## 2019-01-23 ENCOUNTER — Other Ambulatory Visit: Payer: Self-pay

## 2019-01-23 ENCOUNTER — Other Ambulatory Visit (HOSPITAL_COMMUNITY): Payer: BC Managed Care – PPO | Admitting: Medical

## 2019-01-23 DIAGNOSIS — Z8659 Personal history of other mental and behavioral disorders: Secondary | ICD-10-CM

## 2019-01-23 DIAGNOSIS — F1120 Opioid dependence, uncomplicated: Secondary | ICD-10-CM

## 2019-01-23 DIAGNOSIS — F341 Dysthymic disorder: Secondary | ICD-10-CM

## 2019-01-23 DIAGNOSIS — F4310 Post-traumatic stress disorder, unspecified: Secondary | ICD-10-CM

## 2019-01-23 DIAGNOSIS — F1994 Other psychoactive substance use, unspecified with psychoactive substance-induced mood disorder: Secondary | ICD-10-CM

## 2019-01-23 DIAGNOSIS — Z811 Family history of alcohol abuse and dependence: Secondary | ICD-10-CM

## 2019-01-23 DIAGNOSIS — F1910 Other psychoactive substance abuse, uncomplicated: Secondary | ICD-10-CM

## 2019-01-23 DIAGNOSIS — K254 Chronic or unspecified gastric ulcer with hemorrhage: Secondary | ICD-10-CM

## 2019-01-23 DIAGNOSIS — F1124 Opioid dependence with opioid-induced mood disorder: Secondary | ICD-10-CM | POA: Diagnosis not present

## 2019-01-23 DIAGNOSIS — F17220 Nicotine dependence, chewing tobacco, uncomplicated: Secondary | ICD-10-CM

## 2019-01-23 NOTE — Progress Notes (Signed)
   [facility] Follow-up Outpatient CDIOP Date: 01/23/2019 Admission Date:12/27/2018  Sobriety date: 01/21/2019  Subjective: "I was sick all weekend"  HPI : CDIOP Provider FU This is the 2nd FU visit for patient who has not been able to abstain from opiates since starting program. Pt claimed he was taking oral naltrexone and was given Vivitrol shot Thursday 7/2. He as aresult has found stin mulus to re be opiate free. He does want to go 1 day to beach with family and mis treatment. He agrees to remain drug free in exchange.If he fails to do so the day will not be excused. He continiues to c/o GI distress and cant get Rx Zegerid-advised to use OTC til then.  Review of Systems: Psychiatric: Agitation: No Hallucination: No Depressed Mood: Yes much better Insomnia: No Hypersomnia: No Altered Concentration: No Feels Worthless: No Grandiose Ideas: No Belief In Special Powers: No New/Increased Substance Abuse: No Compulsions: No  Neurologic: Headache: No Seizure: No Paresthesias: No  Current Medications: buPROPion 150 MG 12 hr tablet Commonly known as: Wellbutrin SR Take 1 tablet (150 mg total) by mouth 2 (two) times daily.  folic acid 1 MG tablet Commonly known as: FOLVITE   hydrOXYzine 25 MG capsule Commonly known as: VISTARIL Take 1 capsule (25 mg total) by mouth every 6 (six) hours as needed for up to 270 doses for anxiety.  naltrexone 50 MG tablet Commonly known as: DEPADE Take 1 tablet (50 mg total) by mouth daily for 30 days. Take as needed until Injection of vivitrol  Omeprazole-Sodium Bicarbonate 40-1680 MG Pack Take 1 dose now and 1 dose in 8 hrs then 1 dose daily for 4 weeks  * QUEtiapine 50 MG tablet Commonly known as: SEROQUEL Take 1 tablet (50 mg total) by mouth at bedtime.  * QUEtiapine 25 MG tablet Commonly known as: SEROQUEL Take 1 tablet (25 mg total) by mouth every morning for 90 doses.  Vivitrol 380 MG Susr      Vital Signs  Mental Status  Examination  Appearance: Alert: Yes Attention: good  Cooperative: Yes Eye Contact: Good Speech: Clear and coherent Psychomotor Activity: Normal Memory/Concentration: Normal/intact Oriented: person, place, time/date and situation Mood: Euthymic Affect: Appropriate and Congruent Thought Processes and Associations: Coherent and Intact Fund of Knowledge: Good Thought Content: WDL Insight: Good Judgement: Good  UDS:+ Fentanyl 7/6  PDMP-Clear  Diagnosis:  0 Opioid use disorder, severe, dependence (HCC) 0 Polysubstance abuse (HCC) 0 PTSD (post-traumatic stress disorder) 0 Substance induced mood disorder (HCC) 0 Family history of alcoholism in paternal grandfather 0 Dysthymia 0 History of ADHD 0 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   MRN: [id number]

## 2019-01-24 ENCOUNTER — Other Ambulatory Visit (HOSPITAL_COMMUNITY): Payer: BC Managed Care – PPO | Admitting: Licensed Clinical Social Worker

## 2019-01-24 ENCOUNTER — Other Ambulatory Visit: Payer: Self-pay

## 2019-01-24 DIAGNOSIS — F4310 Post-traumatic stress disorder, unspecified: Secondary | ICD-10-CM

## 2019-01-24 DIAGNOSIS — F1124 Opioid dependence with opioid-induced mood disorder: Secondary | ICD-10-CM | POA: Diagnosis not present

## 2019-01-24 DIAGNOSIS — F1994 Other psychoactive substance use, unspecified with psychoactive substance-induced mood disorder: Secondary | ICD-10-CM

## 2019-01-24 DIAGNOSIS — F1120 Opioid dependence, uncomplicated: Secondary | ICD-10-CM

## 2019-01-25 NOTE — Progress Notes (Signed)
    Daily Group Progress Note  Program: CD-IOP   Group Time: 1pm-2:30pm  Participation Level: Active  Behavioral Response: Appropriate  Type of Therapy: Psycho-education Group  Topic: Clinician checked in with clients, assessing for SI/HI/psychosis and overall level of functioning. Clinician inquired about sobriety date and community support meetings attended. Psycho-educational group co-facilitated to utilize Yoga and mindfulness as a part of recovery.    Group Time: 2:30pm-4pm  Participation Level: Active  Behavioral Response: Appropriate  Type of Therapy: Process Group  Topic: Clinician and group members processed any challenges over the holiday weekend. Process group focused on creating SMART goals. Clinician facilitated discussion on creation of goals to support a sober lifestyle and goals of treatment. Members challenged group members about different stages of change and group members changes and intensity of motivation for changed behaviors. Group members shared goals as well as basis behind goals of maintaining sobriety or managing use. Group members discussed how previous treatment has affected decisions as well as consequences of continued use. Clients identified specific goals and how they will be measured.  Clients identified self care activity to support recovery before next group.    Summary: Client identified being sick over the weekend, citing the Vivitrol shot as the cause. Client reports if he had know the results of the shot would be so severe he would have considered it earlier in his treatment history. Client reports good weekend wit his family who have commented on his improvements. Client would like treatment team to considered allowing to miss one group for a family vacation, despite signed behavior contract to not miss any groups for the remainder of treatment. Client stated he understands if he gets kicked out of the program due to missing a group and will  accept that consequence.   Family Program: Family present? No   Name of family member(s): NA  UDS collected: Yes Results: from 01/14/19 includes fentanyl and THC  AA/NA attended?: Yes 2 mtgs since group on Thursday  Sponsor?: Yes   Oneita Kras A Oracio Galen, LCSW, LCAS

## 2019-01-28 ENCOUNTER — Other Ambulatory Visit (HOSPITAL_COMMUNITY): Payer: BC Managed Care – PPO

## 2019-01-28 ENCOUNTER — Encounter (HOSPITAL_COMMUNITY): Payer: Self-pay | Admitting: Medical

## 2019-01-28 ENCOUNTER — Other Ambulatory Visit: Payer: Self-pay

## 2019-01-28 NOTE — Progress Notes (Signed)
    Daily Group Progress Note  Program: CD-IOP   Group Time: 1pm-2:30pm  Participation Level: Active  Behavioral Response: Disruptive  Type of Therapy: Process Group  Topic: Clinician checked in with clients, assessing for SI/HI/psychosis/substance use and overall level of functioning. Clinician and group members processed recent stressors, including relationships, contact with toxic people, and mixed, uncomfortable emotions as possible triggers for cravings and relapse. Clinician and group members processed stages of relapse (Emotions, thoughts, actions) and ways to interview. Clinician encouraged clients to be mindful of thoughts/feelings which come with identified triggers and attempt to intervene with alternative thoughts or distraction skills to avoid staying in uncomfortable emotions and developing cravings.   Group Time: 2:30pm-4pm  Participation Level: Minimal  Behavioral Response: Appropriate  Type of Therapy: Psycho-education Group  Topic: Psycho-educational group focused on healthy lifestyle. Go go-facilitated by staff from [facility]. Staff provided information of ways to improve exercise, food, sleep hygiene, and overall wellness.   Summary: Client presented disruptive, with tangential conversations distracting other group members. Client reports 'trying to get one over' on staff about missing multiple days. Client is somewhat supportive in challenging group member motivation for change however deflects when challenged to address his own.   Family Program: Family present? No   Name of family member(s): n/a  UDS collected: No Results:   AA/NA attended?: no, despite being self care plan the previous evening   Sponsor?  yes  Olegario Messier, LCSW

## 2019-01-30 ENCOUNTER — Other Ambulatory Visit (HOSPITAL_COMMUNITY): Payer: BC Managed Care – PPO | Admitting: Licensed Clinical Social Worker

## 2019-01-30 ENCOUNTER — Other Ambulatory Visit: Payer: Self-pay

## 2019-01-30 DIAGNOSIS — F1910 Other psychoactive substance abuse, uncomplicated: Secondary | ICD-10-CM

## 2019-01-30 DIAGNOSIS — F1124 Opioid dependence with opioid-induced mood disorder: Secondary | ICD-10-CM | POA: Diagnosis not present

## 2019-01-30 DIAGNOSIS — F1120 Opioid dependence, uncomplicated: Secondary | ICD-10-CM

## 2019-01-30 DIAGNOSIS — F4310 Post-traumatic stress disorder, unspecified: Secondary | ICD-10-CM

## 2019-01-30 DIAGNOSIS — F1994 Other psychoactive substance use, unspecified with psychoactive substance-induced mood disorder: Secondary | ICD-10-CM

## 2019-01-31 ENCOUNTER — Other Ambulatory Visit (HOSPITAL_COMMUNITY): Payer: BC Managed Care – PPO | Admitting: Licensed Clinical Social Worker

## 2019-01-31 ENCOUNTER — Other Ambulatory Visit: Payer: Self-pay

## 2019-01-31 DIAGNOSIS — F1994 Other psychoactive substance use, unspecified with psychoactive substance-induced mood disorder: Secondary | ICD-10-CM

## 2019-01-31 DIAGNOSIS — F1124 Opioid dependence with opioid-induced mood disorder: Secondary | ICD-10-CM | POA: Diagnosis not present

## 2019-01-31 DIAGNOSIS — F1910 Other psychoactive substance abuse, uncomplicated: Secondary | ICD-10-CM

## 2019-01-31 DIAGNOSIS — F1120 Opioid dependence, uncomplicated: Secondary | ICD-10-CM

## 2019-02-04 ENCOUNTER — Other Ambulatory Visit: Payer: Self-pay

## 2019-02-04 ENCOUNTER — Other Ambulatory Visit (HOSPITAL_COMMUNITY): Payer: BC Managed Care – PPO | Admitting: Licensed Clinical Social Worker

## 2019-02-04 DIAGNOSIS — F1910 Other psychoactive substance abuse, uncomplicated: Secondary | ICD-10-CM

## 2019-02-04 DIAGNOSIS — F1994 Other psychoactive substance use, unspecified with psychoactive substance-induced mood disorder: Secondary | ICD-10-CM

## 2019-02-04 DIAGNOSIS — F4310 Post-traumatic stress disorder, unspecified: Secondary | ICD-10-CM

## 2019-02-04 DIAGNOSIS — F1124 Opioid dependence with opioid-induced mood disorder: Secondary | ICD-10-CM | POA: Diagnosis not present

## 2019-02-04 DIAGNOSIS — F1120 Opioid dependence, uncomplicated: Secondary | ICD-10-CM

## 2019-02-06 ENCOUNTER — Other Ambulatory Visit: Payer: Self-pay

## 2019-02-06 ENCOUNTER — Other Ambulatory Visit (INDEPENDENT_AMBULATORY_CARE_PROVIDER_SITE_OTHER): Payer: BC Managed Care – PPO | Admitting: Licensed Clinical Social Worker

## 2019-02-06 ENCOUNTER — Encounter (HOSPITAL_COMMUNITY): Payer: Self-pay | Admitting: Medical

## 2019-02-06 DIAGNOSIS — F17220 Nicotine dependence, chewing tobacco, uncomplicated: Secondary | ICD-10-CM

## 2019-02-06 DIAGNOSIS — F1124 Opioid dependence with opioid-induced mood disorder: Secondary | ICD-10-CM | POA: Diagnosis not present

## 2019-02-06 DIAGNOSIS — F1994 Other psychoactive substance use, unspecified with psychoactive substance-induced mood disorder: Secondary | ICD-10-CM

## 2019-02-06 DIAGNOSIS — Z811 Family history of alcohol abuse and dependence: Secondary | ICD-10-CM

## 2019-02-06 DIAGNOSIS — K254 Chronic or unspecified gastric ulcer with hemorrhage: Secondary | ICD-10-CM

## 2019-02-06 DIAGNOSIS — F1910 Other psychoactive substance abuse, uncomplicated: Secondary | ICD-10-CM

## 2019-02-06 DIAGNOSIS — F341 Dysthymic disorder: Secondary | ICD-10-CM

## 2019-02-06 DIAGNOSIS — Z8659 Personal history of other mental and behavioral disorders: Secondary | ICD-10-CM

## 2019-02-06 DIAGNOSIS — F1120 Opioid dependence, uncomplicated: Secondary | ICD-10-CM

## 2019-02-06 DIAGNOSIS — F4310 Post-traumatic stress disorder, unspecified: Secondary | ICD-10-CM

## 2019-02-06 DIAGNOSIS — F419 Anxiety disorder, unspecified: Secondary | ICD-10-CM

## 2019-02-06 DIAGNOSIS — Z6372 Alcoholism and drug addiction in family: Secondary | ICD-10-CM

## 2019-02-06 MED ORDER — LAMOTRIGINE 25 MG PO TABS: ORAL_TABLET | ORAL | 0 refills | Status: DC

## 2019-02-06 MED ORDER — LAMOTRIGINE 100 MG PO TABS: 100.0000 mg | ORAL_TABLET | Freq: Every day | ORAL | 2 refills | Status: DC

## 2019-02-06 NOTE — Progress Notes (Addendum)
   [facility] Follow-up Outpatient CDIOP Date: 02/06/2019  Admission Date:12/27/2018  Sobriety date:01/30/2019  Subjective:" I'm going to have clean drug screens from now on.I want to stay 2 more weeks because i wasn't serious when I first started (Drug screens + for opiates and pot continuously)  HPI:2week CD IOP Provider FU Pt seen for FU now about 2 weeks after Vivtrol injection and finally stabilized his drug use. His Pot levels are declining consistent with hx of abstinence as agreed to at previous encounter. Counselor has informed me he has refused to do individual counseling with her Review of Systems: Psychiatric: Agitation: No Hallucination: No Depressed Mood: Yes much better Insomnia: No Hypersomnia: No Altered Concentration: No Feels Worthless: No Grandiose Ideas: No Belief In Special Powers: No New/Increased Substance Abuse: No Compulsions: No  Neurologic: Headache: No Seizure: No Paresthesias: No  Current Medications: buPROPion 150 MG 12 hr tablet Commonly known as: Wellbutrin SR Take 1 tablet (150 mg total) by mouth 2 (two) times daily.  folic acid 1 MG tablet Commonly known as: FOLVITE   hydrOXYzine 25 MG capsule Commonly known as: VISTARIL Take 1 capsule (25 mg total) by mouth every 6 (six) hours as needed for up to 270 doses for anxiety.  * lamoTRIgine 25 MG tablet Commonly known as: LaMICtal Take 1 tab QD x 5days then 2 tabs qd x 5days then 3 tabs QD x5 days then 4 tabs QD x 5days  * lamoTRIgine 100 MG tablet Commonly known as: LaMICtal Take 1 tablet (100 mg total) by mouth daily. Begin 100 mg after finishing 25 mg induction RX  Omeprazole-Sodium Bicarbonate 40-1680 MG Pack Take 1 dose now and 1 dose in 8 hrs then 1 dose daily for 4 weeks  * QUEtiapine 50 MG tablet Commonly known as: SEROQUEL Take 1 tablet (50 mg total) by mouth at bedtime.  * QUEtiapine 25 MG tablet Commonly known as: SEROQUEL Take 1 tablet (25 mg total) by mouth every  morning for 90 doses.  Vivitrol 380 MG Susr Generic drug: Naltrexone    Mental Status Examination  Appearance: Alert: Yes Attention: good  Cooperative: Yes Eye Contact: Good Speech: Clear and coherent Psychomotor Activity: Normal Memory/Concentration: Normal/intact Oriented: person, place, time/date and situation Mood: Continues to c/o mood swings Affect: Appropriate and Congruent Thought Processes and Associations: Coherent and Intact Fund of Knowledge: Good Thought Content: WDL Insight: Questionable Judgement:Tend to be Manipulative  UDS:Per HPI  Diagnosis:  0 Opioid use disorder, severe, dependence (HCC) 0 Polysubstance abuse (HCC) 0 Family history of alcoholism in paternal grandfather 0 PTSD (post-traumatic stress disorder) 0 Substance induced mood disorder (HCC) 0 Dysthymia 0 History of ADHD 0 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 He continues to operate out of his instinctual reward brain.He has stopped drug use finally   Treatment Plan: Continue per admission.Schedule for 1:1 with Brandon Melnick LCAS. Consider request for extension. Darlyne Russian, PA-CPatient ID: Craig Howe, male   DOB: 03-13-1991, 28 y.o.   MRN: [id number]

## 2019-02-07 ENCOUNTER — Other Ambulatory Visit (HOSPITAL_COMMUNITY): Payer: BC Managed Care – PPO | Admitting: Licensed Clinical Social Worker

## 2019-02-07 ENCOUNTER — Other Ambulatory Visit: Payer: Self-pay

## 2019-02-08 NOTE — Progress Notes (Signed)
    Daily Group Progress Note  Program: CD-IOP   Group Time: 1pm-2:30pm  Participation Level: Active  Behavioral Response: Appropriate and Rationalizing  Type of Therapy: Process Group  Topic: Clinician checked in with group members, assessing for SI/HI/psychosis and substance use. Clinician and group members processed triggers since previous meeting. Clinician encouraged clients to present concerns to the group and request feedback. Clinician and group members continued to process levels of motivation and clinician developed discrepancies between works and actions. Clinician facilitated discussion about the changing roles of parents and the role they play. Clinician and group members also processed daily reflection with a focus on humility.   Group Time: 2:30pm-4pm  Participation Level: Active  Behavioral Response: Appropriate  Type of Therapy: Psycho-education Group  Topic: Psycho-educational group focused on the connection of thoughts, feelings, and behaviors. Clinician and group members discussed automatic thoughts and changing automatic addictive thinking to recovery thinking statements. Clinician and group members reviewed process of challenging distorted thoughts and developing new thought patterns.   Summary: Client continues to challenged other group members level of motivation and does ofter ride to treatment. Client identifies 'when bad enough hurt happens, then you do something.' Client agrees that 'rock bottom' and what is unacceptable behavior is different for each person. Client identifies that humility is important in recovery and in relation to step one and humility is something he struggles with often in ot wanting to be perceived as weak. Client presents sarcastic answers to creating realistic, recovery based thoughts or does not provide answers.   Family Program: Family present? No   Name of family member(s): NA  UDS collected: No Results: vivitrol   AA/NA  attended?:No  Sponsor?: Yes   Gwen Sarvis A Veronica Fretz, LCSW, LCAS

## 2019-02-08 NOTE — Progress Notes (Signed)
    Daily Group Progress Note  Program: CD-IOP   Group Time: 1pm-2:30pm  Participation Level: Active  Behavioral Response: Appropriate  Type of Therapy: Psycho-education Group  Topic:  Clinician checked in with group members, assessing for SI/HI/psychosis and overall level of functioning. Psycho-educational portion of group facilitated by pharmacist. Pharmacist provided educational information related to mental health prescriptions as well as prescription medication which address some symptoms of cravings related to alcohol and opioid use.   Group Time: 2:30-4pm  Participation Level: Active  Behavioral Response: Appropriate  Type of Therapy: Process Group  Topic:  Process group focused on continuing to identify high risk situations and developing discrepancies of situations identified, but no behavior changes attempted. Clinician facilities discussion on pros and cons of substitution, urge surfing, mindful breathing, and distraction as ways to help manage when high risk situations cannot be avoided, such as at work, or death of a loved one. Clinician reminded group members of the Opposite Action Skill.    Summary: Client engaged in group services. Client asked questions and made statements with interacting with pharmacist. Client often talked over staff and group members. Client provided some encouragement, and multiple challenges to group members working on identifying and attempting to avoid high risk situations. Client avoided answer most questions about himself or provide broad answer such as "avoid old people, places, and things" without identifying specifics. Client reports being proud of his sobriety however identifies being easier to help others than put actions in place for his own life. Client is planning to attend a meeting plans to see his daughter over the weekend.   Family Program: Family present? No   Name of family member(s): NA  UDS collected: No   AA/NA  attended?: Yes  Sponsor?: Yes   Sanyla Summey A Antonetta Clanton, LCSW, LCAS

## 2019-02-08 NOTE — Progress Notes (Signed)
    Daily Group Progress Note  Program: CD-IOP   Group Time: 1pm-2:30pm  Participation Level: Active  Behavioral Response: Appropriate  Type of Therapy: Process Group  Topic: 1pm-2:30pm Clinician met with group members, assessing for SI/HI/psychosis and overall level of functioning. Clinician and group members processed changes in stressors of the weekend and worked on identifying thoughts and feelings related to cravings. Clinician and group members completed daily reading and processed the topic of humility and acceptance.   Group Time: 2:30pm-4pm  Participation Level: Active  Behavioral Response: Appropriate  Type of Therapy: Psycho-education Group  Topic: Clinician and group members discussed the benefits of 12-Step groups and members shared experiences. Clinician reviewed with group members the connection between thoughts, feelings, and behaviors and how addressing, or not addressing automatic thoughts can effect craving intensities. Clinician and group members completed Automatic thought, both addictive thinking and recovery thinking.    Summary: Reports sobriety date remains same. Client completed daily reading and shared thoughts on step one related to humility. Client provided examples of support from NA groups however reports they are only effective if he attends on a regular basis and he has attened fewer recently. Client identified some self-defeating thoughts which he addresses with common AA mantras daily.   Family Program: Family present? No   Name of family member(s): NA  UDS collected: Yes Results: pending  AA/NA attended?: Yes  Sponsor?: Yes   Karissa A Brone, LCSW, LCAS

## 2019-02-11 ENCOUNTER — Other Ambulatory Visit (HOSPITAL_BASED_OUTPATIENT_CLINIC_OR_DEPARTMENT_OTHER): Payer: BC Managed Care – PPO | Admitting: Medical

## 2019-02-11 ENCOUNTER — Other Ambulatory Visit: Payer: Self-pay

## 2019-02-11 ENCOUNTER — Encounter (HOSPITAL_COMMUNITY): Payer: Self-pay | Admitting: Psychology

## 2019-02-11 DIAGNOSIS — F1120 Opioid dependence, uncomplicated: Secondary | ICD-10-CM | POA: Diagnosis not present

## 2019-02-11 DIAGNOSIS — F17220 Nicotine dependence, chewing tobacco, uncomplicated: Secondary | ICD-10-CM

## 2019-02-11 DIAGNOSIS — Z91199 Patient's noncompliance with other medical treatment and regimen due to unspecified reason: Secondary | ICD-10-CM

## 2019-02-11 DIAGNOSIS — Z811 Family history of alcohol abuse and dependence: Secondary | ICD-10-CM

## 2019-02-11 DIAGNOSIS — K219 Gastro-esophageal reflux disease without esophagitis: Secondary | ICD-10-CM

## 2019-02-11 DIAGNOSIS — F419 Anxiety disorder, unspecified: Secondary | ICD-10-CM | POA: Diagnosis not present

## 2019-02-11 DIAGNOSIS — Z9119 Patient's noncompliance with other medical treatment and regimen: Secondary | ICD-10-CM

## 2019-02-11 DIAGNOSIS — Z6372 Alcoholism and drug addiction in family: Secondary | ICD-10-CM

## 2019-02-11 DIAGNOSIS — F1994 Other psychoactive substance use, unspecified with psychoactive substance-induced mood disorder: Secondary | ICD-10-CM

## 2019-02-11 DIAGNOSIS — K254 Chronic or unspecified gastric ulcer with hemorrhage: Secondary | ICD-10-CM

## 2019-02-11 DIAGNOSIS — F341 Dysthymic disorder: Secondary | ICD-10-CM

## 2019-02-11 DIAGNOSIS — Z8659 Personal history of other mental and behavioral disorders: Secondary | ICD-10-CM

## 2019-02-11 DIAGNOSIS — F1124 Opioid dependence with opioid-induced mood disorder: Secondary | ICD-10-CM | POA: Diagnosis not present

## 2019-02-11 DIAGNOSIS — F4310 Post-traumatic stress disorder, unspecified: Secondary | ICD-10-CM | POA: Diagnosis not present

## 2019-02-11 DIAGNOSIS — Z9111 Patient's noncompliance with dietary regimen: Secondary | ICD-10-CM

## 2019-02-11 DIAGNOSIS — F1910 Other psychoactive substance abuse, uncomplicated: Secondary | ICD-10-CM

## 2019-02-11 NOTE — Progress Notes (Unsigned)
Craig Howe is a 28 y.o. male patient ***. CD-IOP: Individual Counseling Session. Met with the patient at the conclusion of group today. The lead counselor reported he had fallen asleep in group. He had admitted in group today that he had relapsed on Sunday. When I questioned him, the patient reported that on Saturday he had been displaying "using" characteristics and his parents kicked him out of the house. He subsequently used heroin and smoked week that next day. He expressed concern about his time here in the CD-IOP program and stated, "This isn't me". The patient admitted that he had been using for the first couple of weeks, but after that he got serious and was able to attain 3 or 4 weeks of total abstinence. He pointed out that he had followed through on his contract when he went to the beach a couple of weeks ago and had not used. He also stated the intention to enter an [facility] and had already spoken to some people. I encouraged him to become independent and not go back to his parents' home. He agreed that it would probably be best if he didn't go back. The patient asked that he be allowed to stay longer because he still needs to get some things before he is prepared to return to work. I agreed to speak with our Treatment Team and contact him about our decision on the plan going forward. The patient thanked me and I walked him out to the lobby.   ***This patient has struggled to attain any significant sobriety while enrolled in the program and he admittedly relapsed yesterday on heroin and marijuana. He stated he could not provide a urine sample today despite having been given the test package with almost two hours remaining in the session. The lead counselor also reported he had slept in the session today explaining he was tired from using and the weekend.         , LCAS 

## 2019-02-12 ENCOUNTER — Telehealth (HOSPITAL_COMMUNITY): Payer: Self-pay | Admitting: Licensed Clinical Social Worker

## 2019-02-13 ENCOUNTER — Encounter (HOSPITAL_COMMUNITY): Payer: Self-pay

## 2019-02-13 ENCOUNTER — Encounter (HOSPITAL_COMMUNITY): Payer: BC Managed Care – PPO

## 2019-02-13 NOTE — Progress Notes (Signed)
[facility]                                                                                                       CD-IOP DISCHARGE NOTE   Date of Admission: 12/27/2018 Referall Provider:[facility]  Date of Discharge: 02/11/2019 Sobriety Date:Does not have one-claims to hve used over weekend;failed to provide UDS Admission Diagnosis: 1. Opioid use disorder, severe, dependence (HCC)  F11.20   2. PTSD (post-traumatic stress disorder)  F43.10   3. Dysthymia  F34.1   4. Chronic anxiety  F41.9   5. History of ADHD  Z86.59   6. Gastroesophageal reflux disease without esophagitis  K21.9   7. Tinea versicolor  B36.0   8. Substance induced mood disorder (HCC) Active F19.94   9. Polysubstance abuse (HCC)  F19.10    THC,Cocaine  10. Biological father as perpetrator of maltreatment and neglect  Y07.11   5611. Family history of alcoholism in paternal grandfather       Course of Treatment:  Pt course of treatment was erratic.Continued to profess sincere desire to abstain but continued to use over 1st 2 weeks of treatment. Tried to get out of taking his Naltrexone injection but was administered on 7/2.  Group attendance was irregular and disruptive. He was asked to sign Treatment contract because if irregular group attendance and ongoing use issues. Pt refused to meet 1:1 with Counselor until today. Pt reported using over weekend and when asked to provide UDS claimed he wasnt able and left without providing specimen,violating his contract. He was requesting additional 2 weeks time in treatment. Medication management attempted to address his behaviors with changes in antidepressant and addition of Lamictal just recently. He  did c/o hx of Gastric ulcers with bleeding and Zegerid was prescribed per diagnosis.   Medications: buPROPion 150 MG 12 hr tablet Commonly known as: Wellbutrin SR Take 1 tablet (150 mg total) by mouth 2 (two) times daily.  folic acid 1 MG tablet Commonly known as: FOLVITE   hydrOXYzine 25 MG capsule Commonly known as: VISTARIL Take 1 capsule (25 mg total) by mouth every 6 (six) hours as needed for up to 270 doses for anxiety.  * lamoTRIgine 25 MG tablet Commonly known as: LaMICtal Take 1 tab  QD x 5days then 2 tabs qd x 5days then 3 tabs QD x5 days then 4 tabs QD x 5days  * lamoTRIgine 100 MG tablet Commonly known as: LaMICtal Take 1 tablet (100 mg total) by mouth daily. Begin 100 mg after finishing 25 mg induction RX  Omeprazole-Sodium Bicarbonate 40-1680 MG Pack Take 1 dose now and 1 dose in 8 hrs then 1 dose daily for 4 weeks  * QUEtiapine 50 MG tablet Commonly known as: SEROQUEL Take 1 tablet (50 mg total) by mouth at bedtime.  * QUEtiapine 25 MG tablet Commonly known as: SEROQUEL Take 1 tablet (25 mg total) by mouth every morning for 90 doses.  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 0 Dysthymia 0 Chronic anxiety 0 Substance induced mood disorder (HCC) 0 History of ADHD 0 Chronic gastric ulcer with hemorrhage 0 Gastroesophageal reflux disease without esophagitis 0 Noncompliance with therapeutic plan  Plan of Action to Address Continuing Problems:  Goals and Activities to Help Maintain Sobriety: Pt is recommended to long term treatment (90-365 days)as he has failed multiple short term (30 day) and also appears to need Co dependency treatment  at facility specializing in treatment of Adult Children of Alcoholics ([facility]) as many of his behaviors arise from a reward driven brain that NEEDS to control, consistent with characteristics described By Alberteen Sam EDD in Adult Children of Alcoholics  Referrals: Recommende long term stay at [facility]  Next appointment: NA  Prognosis:Guarded at best-given current patterns of risky behavior (using opiates on Vivitrol) he appears sadly at definite risk for a fatal outcome without intervention.  Miracles are possible but are not the purview of medical/psychiatric treatment.    Client has NOT participated in the development of this discharge plan but will  receive a copy of this completed plan

## 2019-02-14 ENCOUNTER — Encounter (HOSPITAL_COMMUNITY): Payer: BC Managed Care – PPO

## 2019-02-14 NOTE — Progress Notes (Incomplete)
    Daily Group Progress Note  Program: CD-IOP   Group Time: ***  Participation Level: Active  Behavioral Response: Rationalizing and Minimizing  Type of Therapy: Process Group  Topic: Clinician checked in with group members, assessing for SI/HI/psychosis and overall level of functioning. Clinician and group members processed Reflection of the Day with a focus on acceptance. Clinician facilitated group process on the effect of acceptance on recovery. Clinician reviewed and discussed with clients Radical Acceptance related to Step One and willingness to fully engage in treatment.    Group Time: ***  Participation Level: Minimal  Behavioral Response: Passive-Aggressive  Type of Therapy: Psycho-education Group  Topic:  Clinician reviewed DBT Mindfulness skills 'WHAT' and 'HOW,' discussing the importance of describing events in a non-judgmental manner. Clinician facilitated Laughter Yoga exercise in session, encouraging clients to participate non-judgmentally. Clinician allowed time for clients to provide feedback about skill.  Summary: ***   Family Program: Family present? No   Name of family member(s): NA  UDS collected: No Results: from 02/04/19: QUANTITY NOT SUFFICIENT FOR ALL TESTS ORDERED  AA/NA attended?: No  Sponsor?: Yes   Catalena Stanhope A Kelven Flater, LCSW, LCAS

## 2019-02-26 NOTE — Progress Notes (Unsigned)
    Daily Group Progress Note  Program: CD-IOP   Group Time: ***  Participation Level: Active  Behavioral Response: Sharing, Rationalizing, Passive-Aggressive and Monopolizing  Type of Therapy: Group Therapy  Topic:  Clinician checked in with group members assessing for SI/HI/psychosis and overall level of functioning. Clinician reviewed sobriety dates and AA/NA meeting attendance. Clinician and group members discussed highs and lows from previous day. Clinician and group members read and processed Reflection of the Day with a focus on addressing fear. Clinician and group members processed common fears in addiction recovery and how to face them.  Clinician and group members participated in 'draw your addiction monster' activity and shared. Clinician and group members reviewed skills for coping with Urges with support material from [facility].    Summary: ***   Family Program: Family present? No   Name of family member(s): *none    AA/NA attended?: {[facility] YES OR NO:22294}{DAYS OF NTIR:44315}  Sponsor?: {[facility] YES OR QM:08676}   Olegario Messier, LCSW

## 2019-03-09 ENCOUNTER — Emergency Department (HOSPITAL_COMMUNITY)
Admission: EM | Admit: 2019-03-09 | Discharge: 2019-03-09 | Disposition: A | Discharge status: Home / Self Care | Payer: BC Managed Care – PPO | Attending: Emergency Medicine | Admitting: Emergency Medicine

## 2019-03-09 ENCOUNTER — Other Ambulatory Visit: Payer: Self-pay

## 2019-03-09 DIAGNOSIS — F1910 Other psychoactive substance abuse, uncomplicated: Secondary | ICD-10-CM | POA: Insufficient documentation

## 2019-03-09 DIAGNOSIS — N179 Acute kidney failure, unspecified: Secondary | ICD-10-CM | POA: Insufficient documentation

## 2019-03-09 DIAGNOSIS — Z87891 Personal history of nicotine dependence: Secondary | ICD-10-CM | POA: Diagnosis not present

## 2019-03-09 DIAGNOSIS — E860 Dehydration: Secondary | ICD-10-CM | POA: Diagnosis not present

## 2019-03-09 DIAGNOSIS — Z79899 Other long term (current) drug therapy: Secondary | ICD-10-CM | POA: Insufficient documentation

## 2019-03-09 DIAGNOSIS — R112 Nausea with vomiting, unspecified: Secondary | ICD-10-CM | POA: Diagnosis present

## 2019-03-09 LAB — CBC WITH DIFFERENTIAL/PLATELET
Abs Immature Granulocytes: 0.06 10*3/uL (ref 0.00–0.07)
Basophils Absolute: 0 10*3/uL (ref 0.0–0.1)
Basophils Relative: 0 %
Eosinophils Absolute: 0 10*3/uL (ref 0.0–0.5)
Eosinophils Relative: 0 %
HCT: 52.5 % — ABNORMAL HIGH (ref 39.0–52.0)
Hemoglobin: 17.3 g/dL — ABNORMAL HIGH (ref 13.0–17.0)
Immature Granulocytes: 1 %
Lymphocytes Relative: 14 %
Lymphs Abs: 1.6 10*3/uL (ref 0.7–4.0)
MCH: 29.6 pg (ref 26.0–34.0)
MCHC: 33 g/dL (ref 30.0–36.0)
MCV: 89.7 fL (ref 80.0–100.0)
Monocytes Absolute: 0.7 10*3/uL (ref 0.1–1.0)
Monocytes Relative: 7 %
Neutro Abs: 8.6 10*3/uL — ABNORMAL HIGH (ref 1.7–7.7)
Neutrophils Relative %: 78 %
Platelets: 223 10*3/uL (ref 150–400)
RBC: 5.85 MIL/uL — ABNORMAL HIGH (ref 4.22–5.81)
RDW: 11.6 % (ref 11.5–15.5)
WBC: 11 10*3/uL — ABNORMAL HIGH (ref 4.0–10.5)
nRBC: 0 % (ref 0.0–0.2)

## 2019-03-09 LAB — COMPREHENSIVE METABOLIC PANEL
ALT: 20 U/L (ref 0–44)
AST: 28 U/L (ref 15–41)
Albumin: 5.3 g/dL — ABNORMAL HIGH (ref 3.5–5.0)
Alkaline Phosphatase: 44 U/L (ref 38–126)
Anion gap: 15 (ref 5–15)
BUN: 17 mg/dL (ref 6–20)
CO2: 24 mmol/L (ref 22–32)
Calcium: 10.1 mg/dL (ref 8.9–10.3)
Chloride: 101 mmol/L (ref 98–111)
Creatinine, Ser: 1.47 mg/dL — ABNORMAL HIGH (ref 0.61–1.24)
GFR calc Af Amer: >60 mL/min (ref >60)
GFR calc non Af Amer: >60 mL/min (ref >60)
Glucose, Bld: 95 mg/dL (ref 70–99)
Potassium: 4.2 mmol/L (ref 3.5–5.1)
Sodium: 140 mmol/L (ref 135–145)
Total Bilirubin: 1.2 mg/dL (ref 0.3–1.2)
Total Protein: 8.9 g/dL — ABNORMAL HIGH (ref 6.5–8.1)

## 2019-03-09 LAB — ETHANOL: Alcohol, Ethyl (B): <10 mg/dL (ref <10)

## 2019-03-09 LAB — RAPID URINE DRUG SCREEN, HOSP PERFORMED
Amphetamines: NOT DETECTED
Barbiturates: NOT DETECTED
Benzodiazepines: NOT DETECTED
Cocaine: NOT DETECTED
Opiates: POSITIVE — AB
Tetrahydrocannabinol: POSITIVE — AB

## 2019-03-09 MED ORDER — ONDANSETRON HCL 4 MG/2ML IJ SOLN
4.0000 mg | Freq: Once | INTRAMUSCULAR | Status: AC
  Administered 2019-03-09: 4 mg via INTRAVENOUS
  Filled 2019-03-09: qty 2

## 2019-03-09 MED ORDER — SODIUM CHLORIDE 0.9 % IV BOLUS
1000.0000 mL | Freq: Once | INTRAVENOUS | Status: AC
  Administered 2019-03-09: 18:00:00 1000 mL via INTRAVENOUS

## 2019-03-09 MED ORDER — BACITRACIN ZINC 500 UNIT/GM EX OINT
TOPICAL_OINTMENT | Freq: Once | CUTANEOUS | Status: AC
  Administered 2019-03-09: 1 via TOPICAL
  Filled 2019-03-09: qty 1.8

## 2019-03-09 NOTE — ED Notes (Signed)
Pt upset and disgruntled about wait time and not knowing what is going on. Advised we were sending labs off and would get him a room as soon as possible. Pt requested sprite but due to vomiting, he is NPO. Provided him with ice chips and warm blanket. Pt agreeable prior to leaving the room.

## 2019-03-09 NOTE — ED Provider Notes (Signed)
[facility]-EMERGENCY DEPT Provider Note   CSN: [id number] Arrival date & time: 03/09/19  1315     History   Chief Complaint Chief Complaint  Patient presents with   Medical Clearance    HPI Craig Howe is a 28 y.o. male.     The history is provided by the patient and medical records. No language interpreter was used.   Craig Howe is a 28 y.o. male  with a PMH as listed below including substance abuse, depression, anxiety, ADHD and GERD who presents to the Emergency Department complaining of nausea, vomiting after using heroin earlier today.  Patient states that he was in remission and just got out of rehab last week, but relapsed.  He feels very dehydrated and requesting IV fluids to help with this.  Denies any abdominal pain.  Had 2 episodes of diarrhea earlier today.  Denies fever, chills.  No chest pain or shortness of breath.  Reports that he actually is going back to another facility in a week or 2 and does not need resources for substance abuse right now. Denies SI/HI. No auditory or visual hallucinations.   Past Medical History:  Diagnosis Date   ADHD    Anxiety    GERD (gastroesophageal reflux disease)    Major depressive disorder    Substance abuse (HCC)     Patient Active Problem List   Diagnosis Date Noted   Dysthymia 01/02/2019   Chronic anxiety 01/02/2019   History of ADHD 01/02/2019   Substance induced mood disorder (HCC) 01/02/2019   Opioid use disorder, severe, dependence (HCC) 12/27/2018   Opiate dependence (HCC) 09/13/2018   MDD (major depressive disorder), recurrent episode, severe (HCC) 09/13/2018   PTSD (post-traumatic stress disorder) 06/01/2018   Gastroesophageal reflux disease without esophagitis 06/01/2018   Tinea versicolor 06/01/2018   History of substance abuse (HCC) 08/15/2017   Attention deficit hyperactivity disorder (ADHD), combined type 10/26/2016   Major depressive disorder, severe  (HCC) 10/26/2016   Opioid dependence in remission (HCC) 10/26/2016    Past Surgical History:  Procedure Laterality Date   HERNIA REPAIR     KNEE SURGERY          Home Medications    Prior to Admission medications   Medication Sig Start Date End Date Taking? Authorizing Provider  buPROPion (WELLBUTRIN SR) 150 MG 12 hr tablet Take 1 tablet (150 mg total) by mouth 2 (two) times daily. 01/09/19 01/09/20  Court JoyKober, Charles E, PA-C  folic acid (FOLVITE) 1 MG tablet  11/13/18   [provider]  hydrOXYzine (VISTARIL) 25 MG capsule Take 1 capsule (25 mg total) by mouth every 6 (six) hours as needed for up to 270 doses for anxiety. 01/02/19   Court JoyKober, Charles E, PA-C  lamoTRIgine (LAMICTAL) 100 MG tablet Take 1 tablet (100 mg total) by mouth daily. Begin 100 mg after finishing 25 mg induction RX 02/06/19 02/06/20  Court JoyKober, Charles E, PA-C  lamoTRIgine (LAMICTAL) 25 MG tablet Take 1 tab QD x 5days then 2 tabs qd x 5days then 3 tabs QD x5 days then 4 tabs QD x 5days 02/06/19   Court JoyKober, Charles E, PA-C  Omeprazole-Sodium Bicarbonate [phone] MG PACK Take 1 dose now and 1 dose in 8 hrs then 1 dose daily for 4 weeks 01/09/19   Court JoyKober, Charles E, PA-C  QUEtiapine (SEROQUEL) 25 MG tablet Take 1 tablet (25 mg total) by mouth every morning for 90 doses. 01/02/19 04/02/19  Court JoyKober, Charles E, PA-C  QUEtiapine (SEROQUEL) 50 MG tablet Take 1 tablet (50 mg total) by mouth at bedtime. 01/02/19   Dara Hoyer, PA-C  VIVITROL [address]  12/03/18   [provider]    Family History No family history on file.  Social History Social History   Tobacco Use   Smoking status: Former Smoker   Smokeless tobacco: Current User    Types: Chew   Tobacco comment: patient reports "a can a day for 14 years"  Substance Use Topics   Alcohol use: No    Frequency: Never   Drug use: Yes    Types: Marijuana, Heroin    Comment: heroin last used yesterday, last used THC one week ago     Allergies   Patient  has no known allergies.   Review of Systems Review of Systems  Gastrointestinal: Positive for diarrhea, nausea and vomiting. Negative for abdominal pain and blood in stool.  All other systems reviewed and are negative.    Physical Exam Updated Vital Signs BP 121/73 (BP Location: Right Arm)    Pulse 71    Temp 98.4 F (36.9 C) (Oral)    Resp 16    Ht 6\' 2"  (1.88 m)    Wt 99.8 kg    SpO2 100%    BMI 28.25 kg/m   Physical Exam Vitals signs and nursing note reviewed.  Constitutional:      General: He is not in acute distress.    Appearance: He is well-developed.  HENT:     Head: Normocephalic and atraumatic.  Neck:     Musculoskeletal: Neck supple.  Cardiovascular:     Rate and Rhythm: Regular rhythm.     Heart sounds: Normal heart sounds. No murmur.  Pulmonary:     Effort: Pulmonary effort is normal. No respiratory distress.     Breath sounds: Normal breath sounds.  Abdominal:     General: There is no distension.     Palpations: Abdomen is soft.     Comments: No abdominal tenderness.  Skin:    General: Skin is warm and dry.  Neurological:     Mental Status: He is alert and oriented to person, place, and time.      ED Treatments / Results  Labs (all labs ordered are listed, but only abnormal results are displayed) Labs Reviewed  COMPREHENSIVE METABOLIC PANEL - Abnormal; Notable for the following components:      Result Value   Creatinine, Ser 1.47 (*)    Total Protein 8.9 (*)    Albumin 5.3 (*)    All other components within normal limits  RAPID URINE DRUG SCREEN, HOSP PERFORMED - Abnormal; Notable for the following components:   Opiates POSITIVE (*)    Tetrahydrocannabinol POSITIVE (*)    All other components within normal limits  CBC WITH DIFFERENTIAL/PLATELET - Abnormal; Notable for the following components:   WBC 11.0 (*)    RBC 5.85 (*)    Hemoglobin 17.3 (*)    HCT 52.5 (*)    Neutro Abs 8.6 (*)    All other components within normal limits  ETHANOL     EKG None  Radiology No results found.  Procedures Procedures (including critical care time)  Medications Ordered in ED Medications  sodium chloride 0.9 % bolus 1,000 mL (0 mLs Intravenous Stopped 03/09/19 1920)  ondansetron (ZOFRAN) injection 4 mg (4 mg Intravenous Given 03/09/19 1825)  bacitracin ointment (1 application Topical Given 03/09/19 1857)     Initial Impression / Assessment and  Plan / ED Course  I have reviewed the triage vital signs and the nursing notes.  Pertinent labs & imaging results that were available during my care of the patient were reviewed by me and considered in my medical decision making (see chart for details).       Craig Howe is a 28 y.o. male who presents to ED for nausea, vomiting and feeling dehydrated after recent heroin use. Patient is afebrile, hemodynamically stable with benign abdominal exam.  Labs reviewed and consistent with dehydration.  Does have bump in creatinine appears hemoconcentrated.  Given Zofran and IV fluids in the emergency department.  Discussed consultation with peer support specialist, however patient states that he just got out of rehab and has all the resources he needs.  He does not need any further help with this.  He tells me that he has a facility that he is actually going to next week. Will recheck after symptomatic treatment and ensure he can tolerate PO.   Feels improved. Ate a meal and several drinks. Safe for discharge home. Return precautions discussed. Encouraged hydration and quitting drugs. PCP follow up for recheck Creatinine.  All questions answered.    Final Clinical Impressions(s) / ED Diagnoses   Final diagnoses:  Polysubstance abuse (HCC)  AKI (acute kidney injury) [facility](HCC)  Dehydration    ED Discharge Orders    None       Alacia Rehmann, Chase PicketJaime Pilcher, PA-C 03/09/19 1954    Samuel JesterMcManus, Kathleen, DO 03/14/19 [phone]

## 2019-03-09 NOTE — Discharge Instructions (Signed)
It was my pleasure taking care of you today!   Stay hydrated. Drink plenty of water.   Follow up with your primary care doctor to recheck kidney function in the next couple of weeks.   Return to ER for new or worsening symptoms, any additional concerns.

## 2019-03-09 NOTE — ED Triage Notes (Addendum)
States relapsed into using heroin again and not taking psych medications for 4 days and now vomiting and states he think he is dehydrated. 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 States parents is trying to throw him out of house due to his heroin use. Just got out of rehab in May of this year. No SI or HI voiced. Informed pt to not eat or drink anything due to vomiting.

## 2019-03-12 ENCOUNTER — Encounter (HOSPITAL_COMMUNITY): Payer: Self-pay

## 2019-03-12 ENCOUNTER — Emergency Department (HOSPITAL_COMMUNITY)
Admission: EM | Admit: 2019-03-12 | Discharge: 2019-03-13 | Disposition: A | Discharge status: Home / Self Care | Payer: BC Managed Care – PPO | Attending: Emergency Medicine | Admitting: Emergency Medicine

## 2019-03-12 DIAGNOSIS — F1120 Opioid dependence, uncomplicated: Secondary | ICD-10-CM | POA: Insufficient documentation

## 2019-03-12 DIAGNOSIS — F1910 Other psychoactive substance abuse, uncomplicated: Secondary | ICD-10-CM

## 2019-03-12 NOTE — ED Triage Notes (Signed)
Pt was seen here Saturday for the same, he's doing heroin, drinking and taking xanax, he states tat there's a program through his work, [employer], but hasn't been accepted yet Pt was discharged on Saturday with other resources

## 2019-03-12 NOTE — Discharge Instructions (Signed)
Use the resources provided to help with your substance use disorder.   Please return to the emergency department for any new or worsening symptoms.

## 2019-03-13 NOTE — ED Provider Notes (Signed)
[facility] DEPT Provider Note   CSN: [id number] Arrival date & time: 03/12/19  2235     History   Chief Complaint Chief Complaint  Patient presents with  . Addiction Problem    HPI Craig Howe is a 28 y.o. male.     HPI   Pt is a 28 y/o male with a h/o ADHD, anxiety, GERD, MDD, substance abuse who presents requesting detox from heroin. He last used about 1 hour pta. He was seen in the ED a few days ago with similar complaints.  He received IV fluids at that time and was discharged in stable condition.  At this time he has no medical complaints.  He is trying to get to a detox program at work but came here for other resources.  He denies SI, HI, AVH.  His mother is at bedside.  Past Medical History:  Diagnosis Date  . ADHD   . Anxiety   . GERD (gastroesophageal reflux disease)   . Major depressive disorder   . Substance abuse [facility])     Patient Active Problem List   Diagnosis Date Noted  . Dysthymia 01/02/2019  . Chronic anxiety 01/02/2019  . History of ADHD 01/02/2019  . Substance induced mood disorder ([place]) 01/02/2019  . Opioid use disorder, severe, dependence ([place]) 12/27/2018  . Opiate dependence ([place]) 09/13/2018  . MDD (major depressive disorder), recurrent episode, severe ([place]) 09/13/2018  . PTSD (post-traumatic stress disorder) 06/01/2018  . Gastroesophageal reflux disease without esophagitis 06/01/2018  . Tinea versicolor 06/01/2018  . History of substance abuse ([place]) 08/15/2017  . Attention deficit hyperactivity disorder (ADHD), combined type 10/26/2016  . Major depressive disorder, severe ([place]) 10/26/2016  . Opioid dependence in remission ([place]) 10/26/2016    Past Surgical History:  Procedure Laterality Date  . HERNIA REPAIR    . KNEE SURGERY          Home Medications    Prior to Admission medications   Medication Sig Start Date End Date Taking? Authorizing Provider  buPROPion (WELLBUTRIN SR) 150 MG 12 hr  tablet Take 1 tablet (150 mg total) by mouth 2 (two) times daily. 01/09/19 01/09/20  Dara Hoyer, PA-C  folic acid (FOLVITE) 1 MG tablet  11/13/18   [provider]  hydrOXYzine (VISTARIL) 25 MG capsule Take 1 capsule (25 mg total) by mouth every 6 (six) hours as needed for up to 270 doses for anxiety. 01/02/19   Dara Hoyer, PA-C  lamoTRIgine (LAMICTAL) 100 MG tablet Take 1 tablet (100 mg total) by mouth daily. Begin 100 mg after finishing 25 mg induction RX 02/06/19 02/06/20  Dara Hoyer, PA-C  lamoTRIgine (LAMICTAL) 25 MG tablet Take 1 tab QD x 5days then 2 tabs qd x 5days then 3 tabs QD x5 days then 4 tabs QD x 5days 02/06/19   Dara Hoyer, PA-C  Omeprazole-Sodium Bicarbonate [phone] MG PACK Take 1 dose now and 1 dose in 8 hrs then 1 dose daily for 4 weeks 01/09/19   Dara Hoyer, PA-C  QUEtiapine (SEROQUEL) 25 MG tablet Take 1 tablet (25 mg total) by mouth every morning for 90 doses. 01/02/19 04/02/19  Dara Hoyer, PA-C  QUEtiapine (SEROQUEL) 50 MG tablet Take 1 tablet (50 mg total) by mouth at bedtime. 01/02/19   Dara Hoyer, PA-C  VIVITROL [address] [place]  12/03/18   [provider]    Family History History reviewed. No pertinent family history.  Social History Social  History   Tobacco Use  . Smoking status: Former Games developermoker  . Smokeless tobacco: Current User    Types: Chew  . Tobacco comment: patient reports "a can a day for 14 years"  Substance Use Topics  . Alcohol use: No    Frequency: Never  . Drug use: Yes    Types: Marijuana, Heroin    Comment: heroin last used yesterday, last used THC one week ago     Allergies   Patient has no known allergies.   Review of Systems Review of Systems  Constitutional: Negative for fever.  Respiratory: Negative for shortness of breath.   Cardiovascular: Negative for chest pain.  Gastrointestinal: Negative for abdominal pain and diarrhea.  Musculoskeletal: Negative for back pain.  Neurological:  Negative for headaches.    Physical Exam Updated Vital Signs BP (!) 151/92 (BP Location: Right Arm)   Pulse (!) 101   Temp 98.7 F (37.1 C) (Oral)   Resp 20   SpO2 96%   Physical Exam Constitutional:      General: He is not in acute distress.    Appearance: He is well-developed.  Eyes:     Conjunctiva/sclera: Conjunctivae normal.  Cardiovascular:     Rate and Rhythm: Normal rate.  Pulmonary:     Effort: Pulmonary effort is normal.  Skin:    General: Skin is warm and dry.  Neurological:     Mental Status: He is alert and oriented to person, place, and time.     Comments: Clear speech, answers questions appropriately, moving all extremities     ED Treatments / Results  Labs (all labs ordered are listed, but only abnormal results are displayed) Labs Reviewed - No data to display  EKG None  Radiology No results found.  Procedures Procedures (including critical care time)  Medications Ordered in ED Medications - No data to display   Initial Impression / Assessment and Plan / ED Course  I have reviewed the triage vital signs and the nursing notes.  Pertinent labs & imaging results that were available during my care of the patient were reviewed by me and considered in my medical decision making (see chart for details).     Final Clinical Impressions(s) / ED Diagnoses   Final diagnoses:  Substance abuse (HCC)   Pt presenting requesting detox from heroin. He is asymptomatic at this time. Denies SI/HI/AVH. He is requesting resources.  I gave resources for substance use counseling and detox programs.  I also offered to give him Rx for Narcan.  He states that he received Rx for this a few days ago when he had his prior visit in the ED.  When discussing the plan for discharge with resources, patient's mother at bedside became upset at the fact that he was being discharged.  I explained our protocol and that he does not have any emergent medical complaint at this time  that would require admission.  I did inform her that [facility] detox program and she can take him there for evaluation.  I also consulted peer support and gave resources as noted above.  Patient discharged in stable condition.  ED Discharge Orders         Ordered    Consult to Peer Support    Provider:  (Not yet assigned)   03/12/19 2358           Karrie MeresCouture, Tiernan Suto S, PA-C 03/13/19 0124    Derwood KaplanNanavati, Ankit, MD 03/19/19 1327

## 2019-03-24 ENCOUNTER — Other Ambulatory Visit (HOSPITAL_COMMUNITY): Payer: Self-pay | Admitting: Medical

## 2020-09-09 ENCOUNTER — Emergency Department (HOSPITAL_BASED_OUTPATIENT_CLINIC_OR_DEPARTMENT_OTHER): Payer: No Typology Code available for payment source

## 2020-09-09 ENCOUNTER — Encounter (HOSPITAL_BASED_OUTPATIENT_CLINIC_OR_DEPARTMENT_OTHER): Payer: Self-pay | Admitting: Emergency Medicine

## 2020-09-09 ENCOUNTER — Emergency Department (HOSPITAL_BASED_OUTPATIENT_CLINIC_OR_DEPARTMENT_OTHER)
Admission: EM | Admit: 2020-09-09 | Discharge: 2020-09-09 | Disposition: A | Discharge status: Home / Self Care | Payer: No Typology Code available for payment source | Attending: Emergency Medicine | Admitting: Emergency Medicine

## 2020-09-09 ENCOUNTER — Other Ambulatory Visit: Payer: Self-pay

## 2020-09-09 DIAGNOSIS — T07XXXA Unspecified multiple injuries, initial encounter: Secondary | ICD-10-CM

## 2020-09-09 DIAGNOSIS — S60922A Unspecified superficial injury of left hand, initial encounter: Secondary | ICD-10-CM | POA: Diagnosis present

## 2020-09-09 DIAGNOSIS — S01111A Laceration without foreign body of right eyelid and periocular area, initial encounter: Secondary | ICD-10-CM | POA: Insufficient documentation

## 2020-09-09 DIAGNOSIS — S62323A Displaced fracture of shaft of third metacarpal bone, left hand, initial encounter for closed fracture: Secondary | ICD-10-CM | POA: Diagnosis not present

## 2020-09-09 DIAGNOSIS — Z87891 Personal history of nicotine dependence: Secondary | ICD-10-CM | POA: Insufficient documentation

## 2020-09-09 DIAGNOSIS — S50811A Abrasion of right forearm, initial encounter: Secondary | ICD-10-CM | POA: Insufficient documentation

## 2020-09-09 DIAGNOSIS — Y92410 Unspecified street and highway as the place of occurrence of the external cause: Secondary | ICD-10-CM | POA: Insufficient documentation

## 2020-09-09 DIAGNOSIS — S62355A Nondisplaced fracture of shaft of fourth metacarpal bone, left hand, initial encounter for closed fracture: Secondary | ICD-10-CM | POA: Diagnosis not present

## 2020-09-09 MED ORDER — LIDOCAINE-EPINEPHRINE (PF) 2 %-1:200000 IJ SOLN
20.0000 mL | Freq: Once | INTRAMUSCULAR | Status: AC
  Administered 2020-09-09: 20 mL via INTRADERMAL
  Filled 2020-09-09: qty 20

## 2020-09-09 MED ORDER — HYDROCODONE-ACETAMINOPHEN 5-325 MG PO TABS: 1.0000 | ORAL_TABLET | Freq: Three times a day (TID) | ORAL | 0 refills | Status: AC | PRN

## 2020-09-09 MED ORDER — OXYCODONE-ACETAMINOPHEN 5-325 MG PO TABS
1.0000 | ORAL_TABLET | Freq: Once | ORAL | Status: AC
  Administered 2020-09-09: 1 via ORAL
  Filled 2020-09-09: qty 1

## 2020-09-09 NOTE — Discharge Instructions (Addendum)
For pain control you may take at 1000 mg of Tylenol every 8 hours scheduled.  In addition you can take 0.5 to 1 tablet of Norco/Vicodin every 8 hours for pain not controlled with the scheduled Tylenol.  
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
## 2020-09-09 NOTE — ED Notes (Signed)
EDP at bedside  

## 2020-09-15 ENCOUNTER — Encounter (HOSPITAL_BASED_OUTPATIENT_CLINIC_OR_DEPARTMENT_OTHER): Payer: Self-pay | Admitting: Orthopedic Surgery

## 2020-09-15 ENCOUNTER — Other Ambulatory Visit: Payer: Self-pay

## 2020-09-15 ENCOUNTER — Other Ambulatory Visit: Payer: Self-pay | Admitting: Orthopedic Surgery

## 2020-09-18 ENCOUNTER — Other Ambulatory Visit (HOSPITAL_COMMUNITY)
Admission: RE | Admit: 2020-09-18 | Discharge: 2020-09-18 | Disposition: A | Discharge status: Home / Self Care | Payer: BC Managed Care – PPO | Source: Ambulatory Visit | Attending: Orthopedic Surgery | Admitting: Orthopedic Surgery

## 2020-09-18 DIAGNOSIS — Z20822 Contact with and (suspected) exposure to covid-19: Secondary | ICD-10-CM | POA: Diagnosis not present

## 2020-09-18 DIAGNOSIS — S62323A Displaced fracture of shaft of third metacarpal bone, left hand, initial encounter for closed fracture: Secondary | ICD-10-CM | POA: Diagnosis not present

## 2020-09-18 DIAGNOSIS — Z01812 Encounter for preprocedural laboratory examination: Secondary | ICD-10-CM | POA: Insufficient documentation

## 2020-09-18 DIAGNOSIS — Y92410 Unspecified street and highway as the place of occurrence of the external cause: Secondary | ICD-10-CM | POA: Diagnosis not present

## 2020-09-18 DIAGNOSIS — S62325A Displaced fracture of shaft of fourth metacarpal bone, left hand, initial encounter for closed fracture: Secondary | ICD-10-CM | POA: Diagnosis not present

## 2020-09-18 LAB — SARS CORONAVIRUS 2 (TAT 6-24 HRS): SARS Coronavirus 2: NEGATIVE

## 2020-09-18 NOTE — Progress Notes (Signed)
Reminded pt to go for covid test, pt states is going now.  
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
## 2020-09-18 NOTE — Progress Notes (Signed)

## 2020-09-21 ENCOUNTER — Encounter (HOSPITAL_BASED_OUTPATIENT_CLINIC_OR_DEPARTMENT_OTHER): Payer: Self-pay | Admitting: Orthopedic Surgery

## 2020-09-21 ENCOUNTER — Ambulatory Visit (HOSPITAL_COMMUNITY): Payer: BC Managed Care – PPO

## 2020-09-21 ENCOUNTER — Ambulatory Visit (HOSPITAL_BASED_OUTPATIENT_CLINIC_OR_DEPARTMENT_OTHER)
Admission: RE | Admit: 2020-09-21 | Discharge: 2020-09-21 | Disposition: A | Discharge status: Home / Self Care | Payer: BC Managed Care – PPO | Attending: Orthopedic Surgery | Admitting: Orthopedic Surgery

## 2020-09-21 ENCOUNTER — Encounter (HOSPITAL_BASED_OUTPATIENT_CLINIC_OR_DEPARTMENT_OTHER)
Admission: RE | Disposition: A | Discharge status: Home / Self Care | Payer: Self-pay | Source: Home / Self Care | Attending: Orthopedic Surgery

## 2020-09-21 ENCOUNTER — Other Ambulatory Visit: Payer: Self-pay

## 2020-09-21 ENCOUNTER — Ambulatory Visit (HOSPITAL_BASED_OUTPATIENT_CLINIC_OR_DEPARTMENT_OTHER): Payer: BC Managed Care – PPO | Admitting: Anesthesiology

## 2020-09-21 DIAGNOSIS — S62323A Displaced fracture of shaft of third metacarpal bone, left hand, initial encounter for closed fracture: Secondary | ICD-10-CM | POA: Diagnosis not present

## 2020-09-21 DIAGNOSIS — S62325A Displaced fracture of shaft of fourth metacarpal bone, left hand, initial encounter for closed fracture: Secondary | ICD-10-CM | POA: Insufficient documentation

## 2020-09-21 DIAGNOSIS — S2249XA Multiple fractures of ribs, unspecified side, initial encounter for closed fracture: Secondary | ICD-10-CM

## 2020-09-21 DIAGNOSIS — Y92410 Unspecified street and highway as the place of occurrence of the external cause: Secondary | ICD-10-CM | POA: Insufficient documentation

## 2020-09-21 DIAGNOSIS — Z419 Encounter for procedure for purposes other than remedying health state, unspecified: Secondary | ICD-10-CM

## 2020-09-21 DIAGNOSIS — Z20822 Contact with and (suspected) exposure to covid-19: Secondary | ICD-10-CM | POA: Insufficient documentation

## 2020-09-21 HISTORY — PX: OPEN REDUCTION INTERNAL FIXATION (ORIF) METACARPAL: SHX6234

## 2020-09-21 SURGERY — OPEN REDUCTION INTERNAL FIXATION (ORIF) METACARPAL
Anesthesia: Monitor Anesthesia Care | Site: Finger | Laterality: Left

## 2020-09-21 MED ORDER — CEFAZOLIN SODIUM-DEXTROSE 2-4 GM/100ML-% IV SOLN
INTRAVENOUS | Status: AC
  Filled 2020-09-21: qty 100

## 2020-09-21 MED ORDER — OXYCODONE HCL 5 MG/5ML PO SOLN: 5.0000 mg | Freq: Once | ORAL | Status: DC | PRN

## 2020-09-21 MED ORDER — MIDAZOLAM HCL 2 MG/2ML IJ SOLN
INTRAMUSCULAR | Status: AC
  Filled 2020-09-21: qty 2

## 2020-09-21 MED ORDER — LACTATED RINGERS IV SOLN: INTRAVENOUS | Status: DC

## 2020-09-21 MED ORDER — FENTANYL CITRATE (PF) 100 MCG/2ML IJ SOLN
100.0000 ug | Freq: Once | INTRAMUSCULAR | Status: AC
  Administered 2020-09-21: 100 ug via INTRAVENOUS

## 2020-09-21 MED ORDER — IBUPROFEN 200 MG PO TABS: 600.0000 mg | ORAL_TABLET | Freq: Four times a day (QID) | ORAL | Status: DC

## 2020-09-21 MED ORDER — ACETAMINOPHEN 500 MG PO TABS: 1000.0000 mg | ORAL_TABLET | Freq: Once | ORAL | Status: DC | PRN

## 2020-09-21 MED ORDER — OXYCODONE HCL 5 MG PO TABS: 5.0000 mg | ORAL_TABLET | Freq: Four times a day (QID) | ORAL | 0 refills | Status: DC | PRN

## 2020-09-21 MED ORDER — BUPIVACAINE-EPINEPHRINE (PF) 0.5% -1:200000 IJ SOLN
INTRAMUSCULAR | Status: DC | PRN
  Administered 2020-09-21: 30 mL via PERINEURAL

## 2020-09-21 MED ORDER — PROPOFOL 500 MG/50ML IV EMUL
INTRAVENOUS | Status: DC | PRN
  Administered 2020-09-21: 200 ug/kg/min via INTRAVENOUS

## 2020-09-21 MED ORDER — ONDANSETRON HCL 4 MG/2ML IJ SOLN
INTRAMUSCULAR | Status: DC | PRN
  Administered 2020-09-21: 4 mg via INTRAVENOUS

## 2020-09-21 MED ORDER — CEFAZOLIN SODIUM-DEXTROSE 2-4 GM/100ML-% IV SOLN
2.0000 g | INTRAVENOUS | Status: AC
  Administered 2020-09-21: 2 g via INTRAVENOUS

## 2020-09-21 MED ORDER — ACETAMINOPHEN 160 MG/5ML PO SOLN: 1000.0000 mg | Freq: Once | ORAL | Status: DC | PRN

## 2020-09-21 MED ORDER — OXYCODONE HCL 5 MG PO TABS: 5.0000 mg | ORAL_TABLET | Freq: Once | ORAL | Status: DC | PRN

## 2020-09-21 MED ORDER — MIDAZOLAM HCL 2 MG/2ML IJ SOLN
2.0000 mg | Freq: Once | INTRAMUSCULAR | Status: AC
  Administered 2020-09-21: 2 mg via INTRAVENOUS

## 2020-09-21 MED ORDER — FENTANYL CITRATE (PF) 100 MCG/2ML IJ SOLN
INTRAMUSCULAR | Status: AC
  Filled 2020-09-21: qty 2

## 2020-09-21 MED ORDER — 0.9 % SODIUM CHLORIDE (POUR BTL) OPTIME
TOPICAL | Status: DC | PRN
  Administered 2020-09-21: 200 mL

## 2020-09-21 MED ORDER — ACETAMINOPHEN 10 MG/ML IV SOLN: 1000.0000 mg | Freq: Once | INTRAVENOUS | Status: DC | PRN

## 2020-09-21 MED ORDER — ACETAMINOPHEN 325 MG PO TABS: 650.0000 mg | ORAL_TABLET | Freq: Four times a day (QID) | ORAL | Status: DC

## 2020-09-21 MED ORDER — FENTANYL CITRATE (PF) 100 MCG/2ML IJ SOLN: 25.0000 ug | INTRAMUSCULAR | Status: DC | PRN

## 2020-09-21 MED ORDER — LIDOCAINE-EPINEPHRINE (PF) 1.5 %-1:200000 IJ SOLN
INTRAMUSCULAR | Status: DC | PRN
  Administered 2020-09-21: 10 mL via PERINEURAL

## 2020-09-21 SURGICAL SUPPLY — 64 items
APL PRP STRL LF DISP 70% ISPRP (MISCELLANEOUS) ×1
BIT DRILL 1.1X60MM (BIT) IMPLANT
BLADE MINI RND TIP GREEN BEAV (BLADE) IMPLANT
BLADE SURG 15 STRL LF DISP TIS (BLADE) ×1 IMPLANT
BLADE SURG 15 STRL SS (BLADE) ×4
BNDG CMPR 9X4 STRL LF SNTH (GAUZE/BANDAGES/DRESSINGS) ×1
BNDG COHESIVE 4X5 TAN STRL (GAUZE/BANDAGES/DRESSINGS) ×2 IMPLANT
BNDG ESMARK 4X9 LF (GAUZE/BANDAGES/DRESSINGS) ×2 IMPLANT
BNDG GAUZE ELAST 4 BULKY (GAUZE/BANDAGES/DRESSINGS) ×2 IMPLANT
CANISTER SUCT 1200ML W/VALVE (MISCELLANEOUS) IMPLANT
CHLORAPREP W/TINT 26 (MISCELLANEOUS) ×2 IMPLANT
CORD BIPOLAR FORCEPS 12FT (ELECTRODE) ×2 IMPLANT
COVER BACK TABLE 60X90IN (DRAPES) ×2 IMPLANT
COVER MAYO STAND STRL (DRAPES) ×2 IMPLANT
COVER WAND RF STERILE (DRAPES) IMPLANT
CUFF TOURN SGL QUICK 18X4 (TOURNIQUET CUFF) ×1 IMPLANT
DRAPE C-ARM 42X72 X-RAY (DRAPES) ×2 IMPLANT
DRAPE EXTREMITY T 121X128X90 (DISPOSABLE) ×2 IMPLANT
DRAPE SURG 17X23 STRL (DRAPES) ×2 IMPLANT
DRILL 1.1X60MM (BIT) ×2
DRIVER BIT 1.5 (TRAUMA) ×1 IMPLANT
DRSG EMULSION OIL 3X3 NADH (GAUZE/BANDAGES/DRESSINGS) ×2 IMPLANT
GAUZE SPONGE 4X4 12PLY STRL LF (GAUZE/BANDAGES/DRESSINGS) ×2 IMPLANT
GLOVE SRG 8 PF TXTR STRL LF DI (GLOVE) ×1 IMPLANT
GLOVE SURG ENC MOIS LTX SZ7.5 (GLOVE) ×2 IMPLANT
GLOVE SURG LTX SZ6.5 (GLOVE) ×2 IMPLANT
GLOVE SURG UNDER POLY LF SZ7 (GLOVE) ×2 IMPLANT
GLOVE SURG UNDER POLY LF SZ8 (GLOVE) ×2
GOWN STRL REUS W/ TWL LRG LVL3 (GOWN DISPOSABLE) ×2 IMPLANT
GOWN STRL REUS W/TWL LRG LVL3 (GOWN DISPOSABLE) ×2
GOWN STRL REUS W/TWL XL LVL3 (GOWN DISPOSABLE) ×4 IMPLANT
K-WIRE DBL TRONS .035X6 (WIRE) ×2
KWIRE DBL TRONS .035X6 (WIRE) IMPLANT
LOCK SCREW 1.5X9MM (Screw) ×2 IMPLANT
NEEDLE HYPO 25X1 1.5 SAFETY (NEEDLE) IMPLANT
NS IRRIG 1000ML POUR BTL (IV SOLUTION) ×2 IMPLANT
PACK BASIN DAY SURGERY FS (CUSTOM PROCEDURE TRAY) ×2 IMPLANT
PADDING CAST ABS 4INX4YD NS (CAST SUPPLIES)
PADDING CAST ABS COTTON 4X4 ST (CAST SUPPLIES) IMPLANT
PLATE BONE CONT 1.5MM LOCKING (Plate) ×1 IMPLANT
PLATE STRAIGHT LOCK 1.5 (Plate) ×1 IMPLANT
SCREW L 1.5X12 (Screw) ×5 IMPLANT
SCREW L 1.5X14 (Screw) ×1 IMPLANT
SCREW LOCK 1.5X9MM (Screw) IMPLANT
SCREW LOCKING 1.5X11MM (Screw) ×1 IMPLANT
SCREW LOCKING 1.5X13MM (Screw) ×3 IMPLANT
SCREW LOCKING 1.5X16 (Screw) ×4 IMPLANT
SLEEVE SCD COMPRESS KNEE MED (STOCKING) ×2 IMPLANT
SLING ARM FOAM STRAP LRG (SOFTGOODS) ×2 IMPLANT
SPLINT PLASTER CAST XFAST 3X15 (CAST SUPPLIES) ×1 IMPLANT
SPLINT PLASTER XTRA FASTSET 3X (CAST SUPPLIES)
SPLINT WRIST 10 LEFT UNV (SOFTGOODS) ×1 IMPLANT
STOCKINETTE 6  STRL (DRAPES) ×2
STOCKINETTE 6 STRL (DRAPES) ×1 IMPLANT
SUCTION FRAZIER HANDLE 10FR (MISCELLANEOUS) ×2
SUCTION TUBE FRAZIER 10FR DISP (MISCELLANEOUS) IMPLANT
SUT VIC AB 4-0 PS2 27 (SUTURE) ×1 IMPLANT
SUT VICRYL RAPIDE 4-0 (SUTURE) IMPLANT
SUT VICRYL RAPIDE 4/0 PS 2 (SUTURE) ×1 IMPLANT
SYR 10ML LL (SYRINGE) IMPLANT
SYR BULB EAR ULCER 3OZ GRN STR (SYRINGE) ×1 IMPLANT
TOWEL GREEN STERILE FF (TOWEL DISPOSABLE) ×3 IMPLANT
TUBE CONNECTING 20X1/4 (TUBING) ×1 IMPLANT
UNDERPAD 30X36 HEAVY ABSORB (UNDERPADS AND DIAPERS) ×2 IMPLANT

## 2020-09-21 NOTE — Interval H&P Note (Signed)
History and Physical Interval Note:  09/21/2020 11:06 AM  Craig Howe  has presented today for surgery, with the diagnosis of LEFT THIRD AND FOURTH METACARPAL FRACTURES.  The various methods of treatment have been discussed with the patient and family. After consideration of risks, benefits and other options for treatment, the patient has consented to  Procedure(s) with comments: OPEN TREATMENT LEFT 3RD AND 4TH METACARPAL FRACTURE (Left) - LENGTH OF SURGERY:105 MINUTES  PRE OP BLOCK as a surgical intervention.  The patient's history has been reviewed, patient examined, no change in status, stable for surgery.  I have reviewed the patient's chart and labs.  Questions were answered to the patient's satisfaction.     Jodi Marble

## 2020-09-21 NOTE — Transfer of Care (Signed)
Immediate Anesthesia Transfer of Care Note  Patient: Craig Howe  Procedure(s) Performed: OPEN TREATMENT LEFT 3RD AND 4TH METACARPAL FRACTURE (Left Finger)  Patient Location: PACU  Anesthesia Type:MAC and Regional  Level of Consciousness: awake, alert  and oriented  Airway & Oxygen Therapy: Patient Spontanous Breathing and Patient connected to face mask oxygen  Post-op Assessment: Report given to RN and Post -op Vital signs reviewed and stable  Post vital signs: Reviewed and stable  Last Vitals:  Vitals Value Taken Time  BP    Temp    Pulse    Resp    SpO2      Last Pain:  Vitals:   09/21/20 1027  TempSrc: Oral  PainSc: 6       Patients Stated Pain Goal: 3 (27/06/23 7628)  Complications: No complications documented.

## 2020-09-21 NOTE — Anesthesia Postprocedure Evaluation (Signed)
Anesthesia Post Note  Patient: Halina Maidens II  Procedure(s) Performed: OPEN TREATMENT LEFT 3RD AND 4TH METACARPAL FRACTURE (Left Finger)     Patient location during evaluation: PACU Anesthesia Type: Regional and MAC Level of consciousness: awake and alert Pain management: pain level controlled Vital Signs Assessment: post-procedure vital signs reviewed and stable Respiratory status: spontaneous breathing and respiratory function stable Cardiovascular status: stable Postop Assessment: no apparent nausea or vomiting Anesthetic complications: no   No complications documented.  Last Vitals:  Vitals:   09/21/20 1415 09/21/20 1453  BP: 128/74 124/83  Pulse: 64 (!) 57  Resp: 14 16  Temp:  36.7 C  SpO2: 98% 100%    Last Pain:  Vitals:   09/21/20 1431  TempSrc:   PainSc: 0-No pain                 Merlinda Frederick

## 2020-09-21 NOTE — Anesthesia Preprocedure Evaluation (Signed)
Anesthesia Evaluation  Patient identified by MRN, date of birth, ID band Patient awake    Reviewed: Allergy & Precautions, H&P , NPO status , Patient's Chart, lab work & pertinent test results  Airway Mallampati: II  TM Distance: >3 FB Neck ROM: Full    Dental  (+) Dental Advisory Given   Pulmonary neg shortness of breath, neg sleep apnea, neg COPD, neg recent URI, former smoker,  Covid-19 Nucleic Acid Test Results Lab Results      Component                Value               Date                      SARSCOV2NAA              NEGATIVE            09/18/2020              breath sounds clear to auscultation       Cardiovascular negative cardio ROS   Rhythm:Regular     Neuro/Psych PSYCHIATRIC DISORDERS Anxiety Depression negative neurological ROS     GI/Hepatic GERD  ,(+)     substance abuse  IV drug use, 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   Endo/Other  negative endocrine ROS  Renal/GU negative Renal ROS  negative genitourinary   Musculoskeletal  (+) 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   Abdominal   Peds  Hematology negative hematology ROS (+)   Anesthesia Other Findings   Reproductive/Obstetrics                            Anesthesia Physical Anesthesia Plan  ASA: II  Anesthesia Plan: MAC and Regional   Post-op Pain Management:    Induction: Intravenous  PONV Risk Score and Plan: 1 and Propofol infusion and Treatment may vary due to age or medical condition  Airway Management Planned: Nasal Cannula  Additional Equipment: None  Intra-op Plan:   Post-operative Plan:   Informed Consent: I have reviewed the patients History and Physical, chart, labs and discussed the procedure including the risks, benefits and alternatives for the proposed anesthesia with the patient or authorized representative who has indicated his/her understanding and acceptance.      Dental advisory given  Plan Discussed with: CRNA and Surgeon  Anesthesia Plan Comments:         Anesthesia Quick Evaluation

## 2020-09-21 NOTE — Op Note (Signed)
09/21/2020  11:07 AM  PATIENT:  Craig Howe  30 y.o. male  PRE-OPERATIVE DIAGNOSIS: Displaced left third and fourth metacarpal shaft fractures  POST-OPERATIVE DIAGNOSIS:  Same  PROCEDURE: ORIF left third metacarpal and fourth metacarpal fractures  SURGEON: Rayvon Char. Grandville Silos, MD  PHYSICIAN ASSISTANT: None  ANESTHESIA:  regional and MAC  SPECIMENS:  None  DRAINS:   None  EBL:  less than 50 mL  PREOPERATIVE INDICATIONS:  Craig Howe is a  30 y.o. male with displaced fractures of the left third and fourth metacarpals.  The risks benefits and alternatives were discussed with the patient preoperatively including but not limited to the risks of infection, bleeding, nerve injury, cardiopulmonary complications, the need for revision surgery, among others, and the patient verbalized understanding and consented to proceed.  OPERATIVE IMPLANTS: Biomet ALPS hand set 1.5 mm plate/screws  OPERATIVE PROCEDURE:  After receiving prophylactic antibiotics and a regional block, the patient was escorted to the operative theatre and placed in a supine position.  A surgical "time-out" was performed during which the planned procedure, proposed operative site, and the correct patient identity were compared to the operative consent and agreement confirmed by the circulating nurse according to current facility policy.  Following application of a tourniquet to the operative extremity, the exposed skin was prepped with Chloraprep and draped in the usual sterile fashion.  The limb was exsanguinated with an Esmarch bandage and the tourniquet inflated to approximately 180mHg higher than systolic BP.  A linear approach was made dorsally overlying the third webspace.  Full-thickness flaps were elevated.  The long finger extensor tendon had juncturae emanating in both directions, radially and ulnarly, which were divided.  At first the tendon was reflected radially.  The periosteum overlying the third  metacarpal was incised and reflected.  The fracture was cleaned of clot and debris and provisionally reduced.  There was a small loose fragment from the dorsum which was excised.  In addition there was a more distal fragment bearing some of the collateral structures which tended to lift up a little bit out of the construct, but lying pretty close alignment when the soft tissues were not reflected.  The major fracture line was distracted and clamped with the appropriate length obtained.  The spider plate was selected and applied, with 4 screws in each fragment.  The remainder of the tabs were bent to appropriately lie alongside the bone and the fast guides that were extra were removed.  The third metacarpal had good length and rotational alignment at this point.  The soft tissues were reflected then radially to allow for an identical procedure to be performed to the fourth metacarpal, with a singular straight plate applied dorsally.  Final images were obtained revealing near-anatomic alignment of both the third and fourth metacarpal fractures with dorsally applied hardware which were extra-articular.  The wound was then irrigated and the fascial-periosteal layers reapproximated with 4-0 Vicryl Rapide running suture over both the third and the fourth metacarpals.  The juncturae were repaired with 4-0 Vicryl suture.  The wound was again irrigated, the tourniquet released and some additional hemostasis obtained with bipolar cautery.  The skin was reapproximated with 4-0 Vicryl Rapide interrupted buried subcuticular suture and running 4-0 Vicryl Rapide horizontal mattress suture.  A short arm splint dressing was applied to include a removable splint component and he was taken to the recovery room in stable condition.  DISPOSITION: [facility] will be [facility] home today[facility] so to begin both active and passive range of motion of the digits, relying upon the Velcro wrist splint  that he presently has, followed by a return appointment to our office in 10 to 15 days at which time he should get new x-rays of the left hand out of splint.

## 2020-09-21 NOTE — Progress Notes (Signed)
Assisted Dr. Moser with left, ultrasound guided, axillary block. Side rails up, monitors on throughout procedure. See vital signs in flow sheet. Tolerated Procedure well. ° °

## 2020-09-21 NOTE — Discharge Instructions (Signed)
Regional Anesthesia Blocks  1. Numbness or the inability to move the "blocked" extremity may last from 3-48 hours after placement. The length of time depends on the medication injected and your individual response to the medication. If the numbness is not going away after 48 hours, call your surgeon.  2. The extremity that is blocked will need to be protected until the numbness is gone and the  Strength has returned. Because you cannot feel it, you will need to take extra care to avoid injury. Because it may be weak, you may have difficulty moving it or using it. You may not know what position it is in without looking at it while the block is in effect.  3. For blocks in the legs and feet, returning to weight bearing and walking needs to be done carefully. You will need to wait until the numbness is entirely gone and the strength has returned. You should be able to move your leg and foot normally before you try and bear weight or walk. You will need someone to be with you when you first try to ensure you do not fall and possibly risk injury.  4. Bruising and tenderness at the needle site are common side effects and will resolve in a few days.  5. Persistent numbness or new problems with movement should be communicated to the surgeon or the [facility] [phone] [facility] [phone]).    Post Anesthesia Home Care Instructions  Activity: Get plenty of rest for the remainder of the day. A responsible individual must stay with you for 24 hours following the procedure.  For the next 24 hours, DO NOT: -Drive a car -Advertising copywriter -Drink alcoholic beverages -Take any medication unless instructed by your physician -Make any legal decisions or sign important papers.  Meals: Start with liquid foods such as gelatin or soup. Progress to regular foods as tolerated. Avoid greasy, spicy, heavy foods. If nausea and/or vomiting occur, drink only clear liquids until the  nausea and/or vomiting subsides. Call your physician if vomiting continues.  Special Instructions/Symptoms: Your throat may feel dry or sore from the anesthesia or the breathing tube placed in your throat during surgery. If this causes discomfort, gargle with warm salt water. The discomfort should disappear within 24 hours.  If you had a scopolamine patch placed behind your ear for the management of post- operative nausea and/or vomiting:  1. The medication in the patch is effective for 72 hours, after which it should be removed.  Wrap patch in a tissue and discard in the trash. Wash hands thoroughly with soap and water. 2. You may remove the patch earlier than 72 hours if you experience unpleasant side effects which may include dry mouth, dizziness or visual disturbances. 3. Avoid touching the patch. Wash your hands with soap and water after contact with the patch.         Discharge Instructions   You have a dressing with a plaster splint incorporated in it. Move your fingers as much as possible, making a full fist and fully opening the fist. Elevate your hand to reduce pain & swelling of the digits.  Ice over the operative site may be helpful to reduce pain & swelling.  DO NOT USE HEAT.  Leave the dressing in place until you return to our office.  You may shower, but keep the bandage clean & dry.  You may drive a car when you are off of prescription pain medications and can safely  control your vehicle with both hands. Our office will call you to arrange follow-up   Please call [phone] during normal business hours or [phone] after hours for any problems. Including the following:  - excessive redness of the incisions - drainage for more than 4 days - fever of more than 101.5 F  *Please note that pain medications will not be refilled after hours or on weekends.

## 2020-09-21 NOTE — Anesthesia Procedure Notes (Addendum)
Anesthesia Regional Block: Axillary brachial plexus block   Pre-Anesthetic Checklist: ,, timeout performed, Correct Patient, Correct Site, Correct Laterality, Correct Procedure, Correct Position, site marked, Risks and benefits discussed,  Surgical consent,  Pre-op evaluation,  At surgeon's request and post-op pain management  Laterality: Left and Upper  Prep: chloraprep       Needles:  Injection technique: Single-shot     Needle Length: 9cm  Needle Gauge: 22     Additional Needles: Arrow StimuQuik ECHO Echogenic Stimulating PNB Needle  Procedures:,,,, ultrasound used (permanent image in chart),,,,  Narrative:  Start time: 09/21/2020 11:22 AM End time: 09/21/2020 11:28 AM Injection made incrementally with aspirations every 5 mL.  Performed by: Personally  Anesthesiologist: Val Eagle, MD

## 2020-09-22 NOTE — Addendum Note (Signed)
Addendum  created 09/22/20 0819 by Lance Coon, CRNA   Charge Capture section accepted

## 2020-09-23 ENCOUNTER — Encounter (HOSPITAL_BASED_OUTPATIENT_CLINIC_OR_DEPARTMENT_OTHER): Payer: Self-pay | Admitting: Orthopedic Surgery

## 2020-09-24 ENCOUNTER — Encounter (HOSPITAL_BASED_OUTPATIENT_CLINIC_OR_DEPARTMENT_OTHER): Payer: Self-pay | Admitting: Orthopedic Surgery

## 2020-09-24 NOTE — Addendum Note (Signed)
Addendum  created 09/24/20 1847 by Val Eagle, MD   Clinical Note Signed, Intraprocedure Blocks edited

## 2020-09-30 ENCOUNTER — Ambulatory Visit: Payer: No Typology Code available for payment source | Attending: Physician Assistant | Admitting: *Deleted

## 2022-05-11 ENCOUNTER — Other Ambulatory Visit: Payer: Self-pay | Admitting: Family Medicine

## 2022-05-11 ENCOUNTER — Other Ambulatory Visit: Payer: Self-pay | Admitting: Nurse Practitioner
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
## 2023-11-03 ENCOUNTER — Ambulatory Visit: Payer: Self-pay

## 2023-11-03 ENCOUNTER — Other Ambulatory Visit: Payer: Self-pay | Admitting: Nurse Practitioner

## 2023-11-03 DIAGNOSIS — Z021 Encounter for pre-employment examination: Secondary | ICD-10-CM

## 2023-11-24 ENCOUNTER — Ambulatory Visit (INDEPENDENT_AMBULATORY_CARE_PROVIDER_SITE_OTHER): Payer: Self-pay | Admitting: Urgent Care

## 2023-11-24 VITALS — BP 125/76 | HR 96 | Ht 74.0 in | Wt 207.0 lb

## 2023-11-24 DIAGNOSIS — R9389 Abnormal findings on diagnostic imaging of other specified body structures: Secondary | ICD-10-CM

## 2023-11-24 DIAGNOSIS — I4510 Unspecified right bundle-branch block: Secondary | ICD-10-CM
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
## 2023-11-24 NOTE — Patient Instructions (Addendum)
 Please go to the imaging facility for your chest xray, no appointment needed. Call prior to determine out of pocket cost  [facility] Address: [address], [place], [place] [address] Phone: [phone]   Below is the address in which you will schedule your follow up after 01/01/24: [facility] at [facility] [address] [place], [place] [address] Phone: [phone]

## 2023-11-25 ENCOUNTER — Encounter: Payer: Self-pay | Admitting: Urgent Care

## 2023-12-19 ENCOUNTER — Ambulatory Visit (INDEPENDENT_AMBULATORY_CARE_PROVIDER_SITE_OTHER): Payer: Self-pay | Admitting: Primary Care
